# Patient Record
Sex: Female | Born: 1975 | Race: White | Hispanic: No | Marital: Married | Smoking: Former smoker
Health system: Southern US, Community
[De-identification: ages and names within clinical notes are randomized; demographics above are authoritative.]

## PROBLEM LIST (undated history)

## (undated) ENCOUNTER — Inpatient Hospital Stay (HOSPITAL_COMMUNITY): Payer: Self-pay

## (undated) DIAGNOSIS — R519 Headache, unspecified: Secondary | ICD-10-CM

## (undated) DIAGNOSIS — M329 Systemic lupus erythematosus, unspecified: Secondary | ICD-10-CM

## (undated) DIAGNOSIS — G8929 Other chronic pain: Secondary | ICD-10-CM

## (undated) DIAGNOSIS — E039 Hypothyroidism, unspecified: Secondary | ICD-10-CM

## (undated) DIAGNOSIS — O26851 Spotting complicating pregnancy, first trimester: Secondary | ICD-10-CM

## (undated) DIAGNOSIS — Z87898 Personal history of other specified conditions: Secondary | ICD-10-CM

## (undated) DIAGNOSIS — Z8669 Personal history of other diseases of the nervous system and sense organs: Secondary | ICD-10-CM

## (undated) DIAGNOSIS — R110 Nausea: Secondary | ICD-10-CM

## (undated) DIAGNOSIS — N3090 Cystitis, unspecified without hematuria: Secondary | ICD-10-CM

## (undated) DIAGNOSIS — F32A Depression, unspecified: Secondary | ICD-10-CM

## (undated) DIAGNOSIS — N979 Female infertility, unspecified: Secondary | ICD-10-CM

## (undated) DIAGNOSIS — F329 Major depressive disorder, single episode, unspecified: Secondary | ICD-10-CM

## (undated) DIAGNOSIS — N61 Mastitis without abscess: Secondary | ICD-10-CM

## (undated) DIAGNOSIS — F419 Anxiety disorder, unspecified: Secondary | ICD-10-CM

## (undated) DIAGNOSIS — R51 Headache: Secondary | ICD-10-CM

## (undated) DIAGNOSIS — A90 Dengue fever [classical dengue]: Secondary | ICD-10-CM

## (undated) DIAGNOSIS — Z8619 Personal history of other infectious and parasitic diseases: Secondary | ICD-10-CM

## (undated) DIAGNOSIS — Z8659 Personal history of other mental and behavioral disorders: Secondary | ICD-10-CM

## (undated) DIAGNOSIS — T7840XA Allergy, unspecified, initial encounter: Secondary | ICD-10-CM

## (undated) HISTORY — PX: ECTOPIC PREGNANCY SURGERY: SHX613

## (undated) HISTORY — DX: Cystitis, unspecified without hematuria: N30.90

## (undated) HISTORY — PX: MANDIBLE FRACTURE SURGERY: SHX706

## (undated) HISTORY — DX: Headache, unspecified: R51.9

## (undated) HISTORY — PX: COLON SURGERY: SHX602

## (undated) HISTORY — DX: Spotting complicating pregnancy, first trimester: O26.851

## (undated) HISTORY — DX: Mastitis without abscess: N61.0

## (undated) HISTORY — DX: Nausea: R11.0

## (undated) HISTORY — DX: Major depressive disorder, single episode, unspecified: F32.9

## (undated) HISTORY — PX: SALPINGECTOMY: SHX328

## (undated) HISTORY — DX: Systemic lupus erythematosus, unspecified: M32.9

## (undated) HISTORY — DX: Personal history of other infectious and parasitic diseases: Z86.19

## (undated) HISTORY — DX: Other chronic pain: G89.29

## (undated) HISTORY — DX: Headache: R51

## (undated) HISTORY — DX: Female infertility, unspecified: N97.9

## (undated) HISTORY — DX: Anxiety disorder, unspecified: F41.9

## (undated) HISTORY — DX: Personal history of other diseases of the nervous system and sense organs: Z86.69

## (undated) HISTORY — DX: Depression, unspecified: F32.A

## (undated) HISTORY — DX: Personal history of other mental and behavioral disorders: Z86.59

## (undated) HISTORY — DX: Hypothyroidism, unspecified: E03.9

## (undated) HISTORY — DX: Personal history of other specified conditions: Z87.898

## (undated) HISTORY — DX: Dengue fever (classical dengue): A90

## (undated) HISTORY — DX: Allergy, unspecified, initial encounter: T78.40XA

---

## 2002-09-19 ENCOUNTER — Other Ambulatory Visit: Admission: RE | Admit: 2002-09-19 | Discharge: 2002-09-19 | Payer: Self-pay | Admitting: Obstetrics and Gynecology

## 2003-10-03 ENCOUNTER — Other Ambulatory Visit: Admission: RE | Admit: 2003-10-03 | Discharge: 2003-10-03 | Payer: Self-pay | Admitting: Obstetrics and Gynecology

## 2004-09-29 ENCOUNTER — Emergency Department (HOSPITAL_COMMUNITY): Admission: EM | Admit: 2004-09-29 | Discharge: 2004-09-29 | Payer: Self-pay | Admitting: Emergency Medicine

## 2005-06-23 ENCOUNTER — Other Ambulatory Visit: Admission: RE | Admit: 2005-06-23 | Discharge: 2005-06-23 | Payer: Self-pay | Admitting: Obstetrics and Gynecology

## 2006-11-22 DIAGNOSIS — O26851 Spotting complicating pregnancy, first trimester: Secondary | ICD-10-CM

## 2006-11-22 HISTORY — DX: Spotting complicating pregnancy, first trimester: O26.851

## 2006-11-23 ENCOUNTER — Emergency Department (HOSPITAL_COMMUNITY): Admission: EM | Admit: 2006-11-23 | Discharge: 2006-11-24 | Payer: Self-pay | Admitting: Emergency Medicine

## 2006-11-24 DIAGNOSIS — R110 Nausea: Secondary | ICD-10-CM

## 2006-11-24 HISTORY — DX: Nausea: R11.0

## 2006-11-26 ENCOUNTER — Ambulatory Visit: Admission: AD | Admit: 2006-11-26 | Discharge: 2006-11-26 | Payer: Self-pay | Admitting: Obstetrics and Gynecology

## 2006-11-26 ENCOUNTER — Encounter (INDEPENDENT_AMBULATORY_CARE_PROVIDER_SITE_OTHER): Payer: Self-pay | Admitting: Obstetrics and Gynecology

## 2007-05-02 ENCOUNTER — Ambulatory Visit: Admission: AD | Admit: 2007-05-02 | Discharge: 2007-05-02 | Payer: Self-pay | Admitting: Obstetrics and Gynecology

## 2007-05-02 ENCOUNTER — Encounter (INDEPENDENT_AMBULATORY_CARE_PROVIDER_SITE_OTHER): Payer: Self-pay | Admitting: Obstetrics and Gynecology

## 2007-09-13 DIAGNOSIS — Z87898 Personal history of other specified conditions: Secondary | ICD-10-CM

## 2007-09-13 HISTORY — DX: Personal history of other specified conditions: Z87.898

## 2008-05-07 ENCOUNTER — Encounter (INDEPENDENT_AMBULATORY_CARE_PROVIDER_SITE_OTHER): Payer: Self-pay | Admitting: Obstetrics and Gynecology

## 2008-05-07 ENCOUNTER — Inpatient Hospital Stay (HOSPITAL_COMMUNITY): Admission: AD | Admit: 2008-05-07 | Discharge: 2008-05-09 | Payer: Self-pay | Admitting: Obstetrics and Gynecology

## 2008-05-24 DIAGNOSIS — N61 Mastitis without abscess: Secondary | ICD-10-CM

## 2008-05-24 HISTORY — DX: Mastitis without abscess: N61.0

## 2008-10-17 IMAGING — US US OB COMP LESS 14 WK
1 series · 14 of 28 positions shown · non-contrast
Comparison: none

CLINICAL DATA: Abdominal pain.  
OBSTETRICAL ULTRASOUND <14 WKS:
TECHNIQUE: Transabdominal ultrasound was performed for evaluation of the gestation as well as the maternal uterus and adnexal regions.

[Series 1: unknown · 0.32mm/px · 14 of 42 slices shown]
[im 2/42]
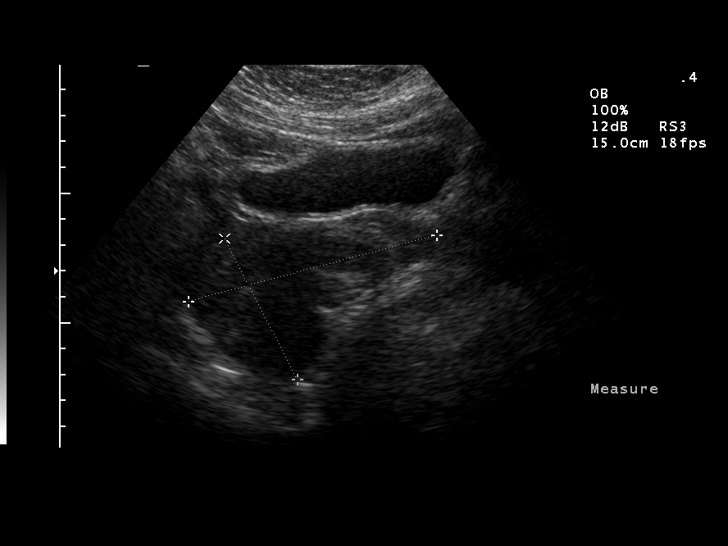
[im 5/42]
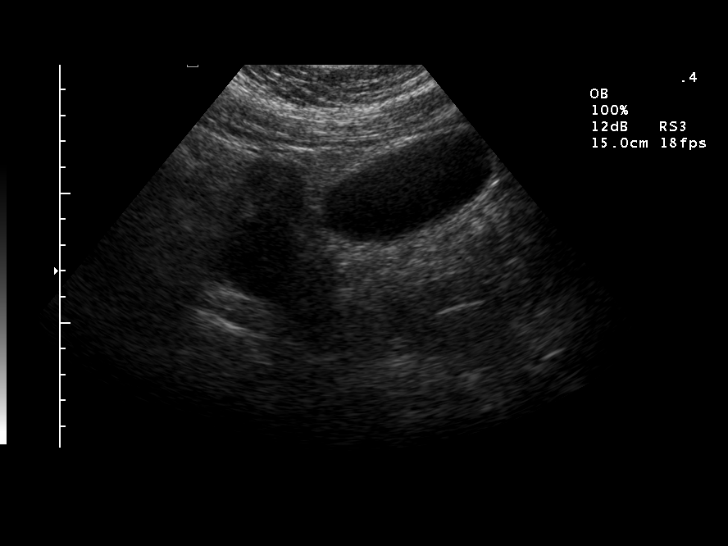
[im 8/42]
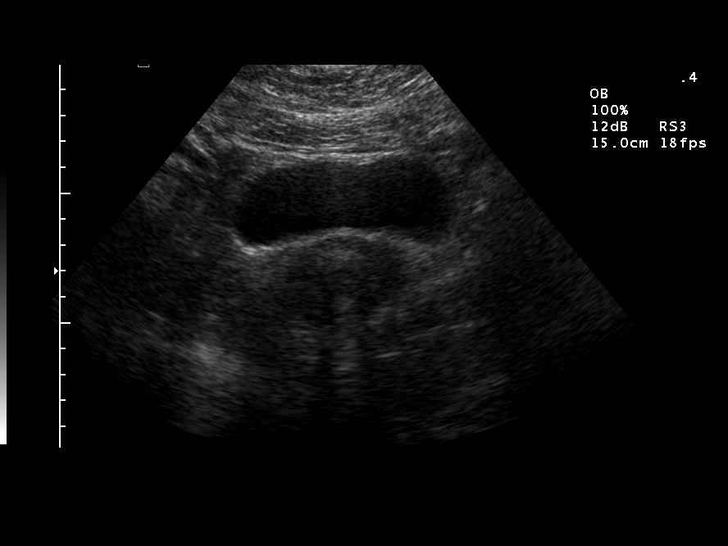
[im 11/42]
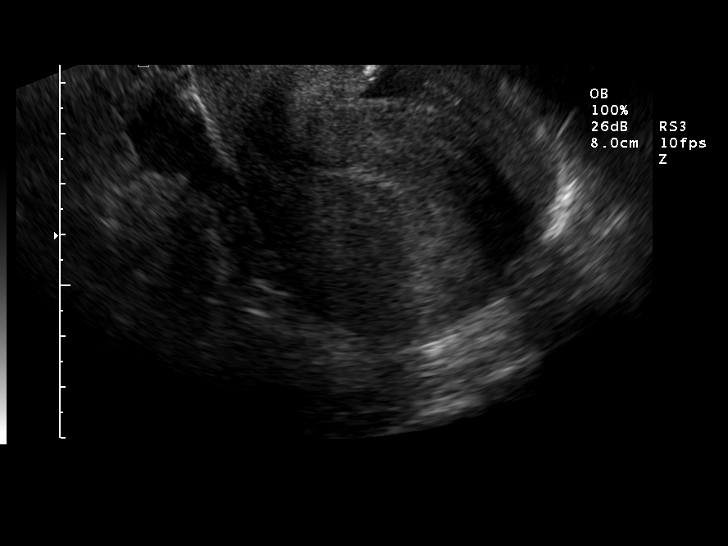
[im 14/42]
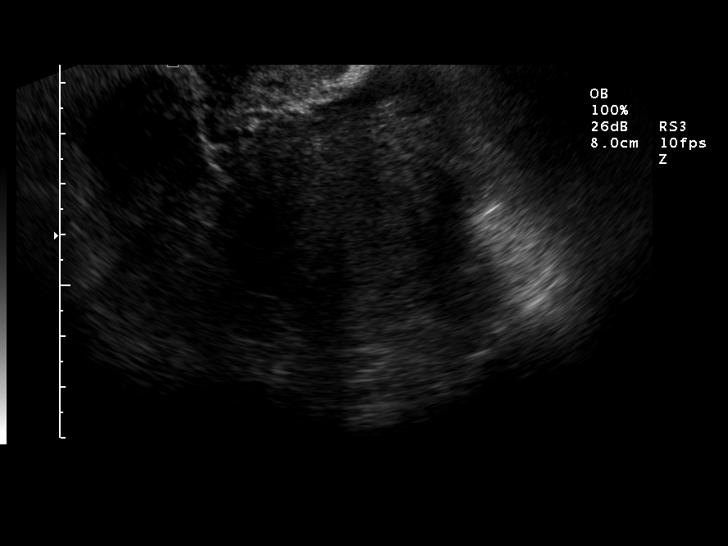
[im 17/42]
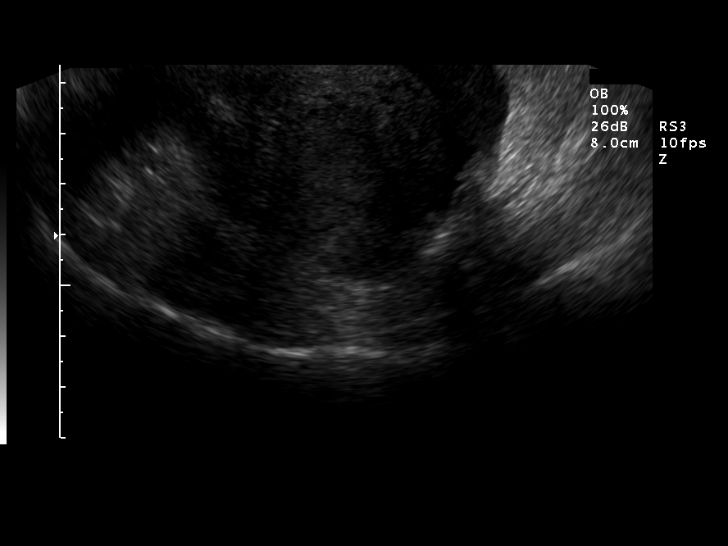
[im 20/42]
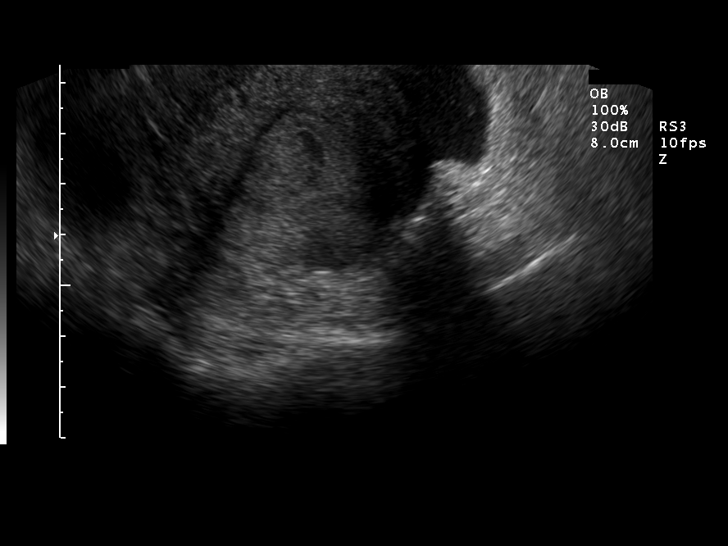
[im 23/42]
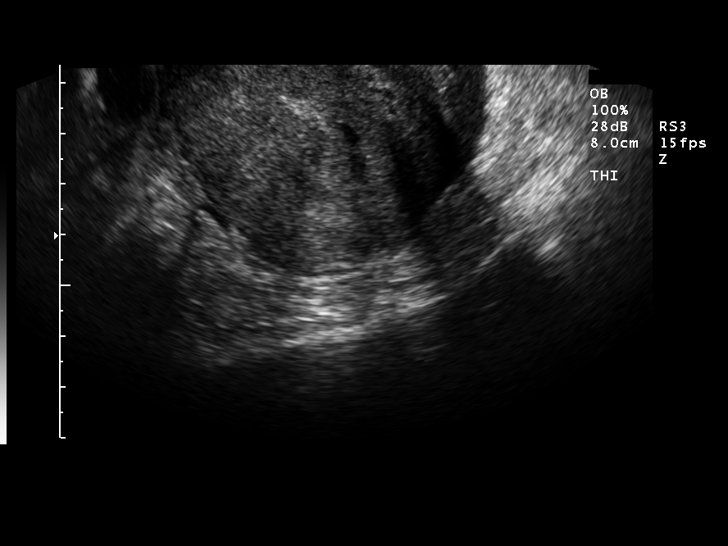
[im 26/42]
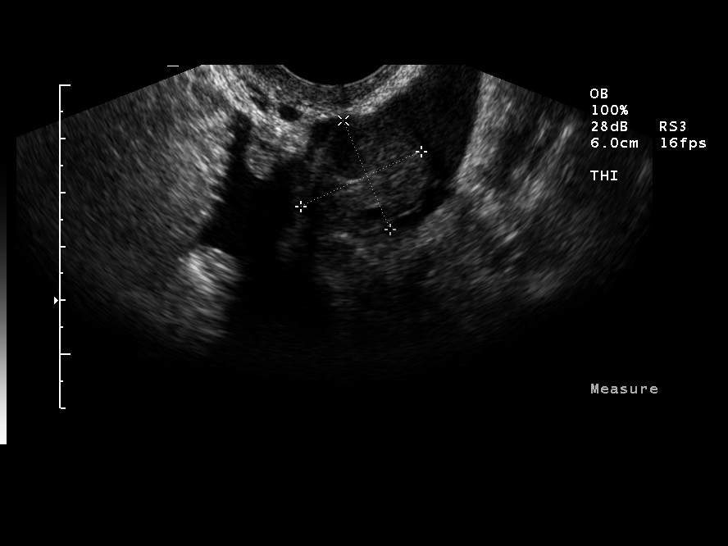
[im 29/42]
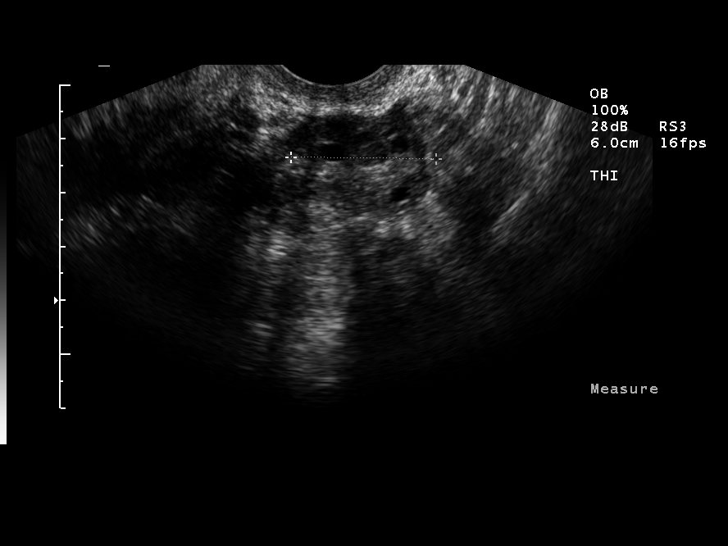
[im 32/42]
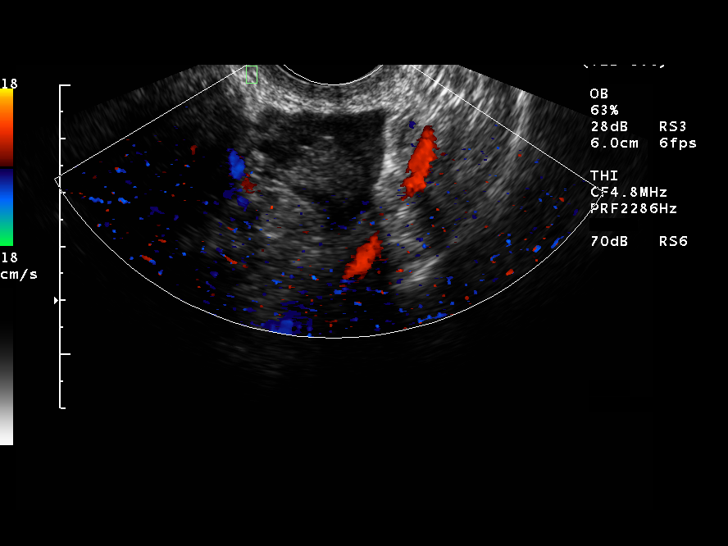
[im 35/42]
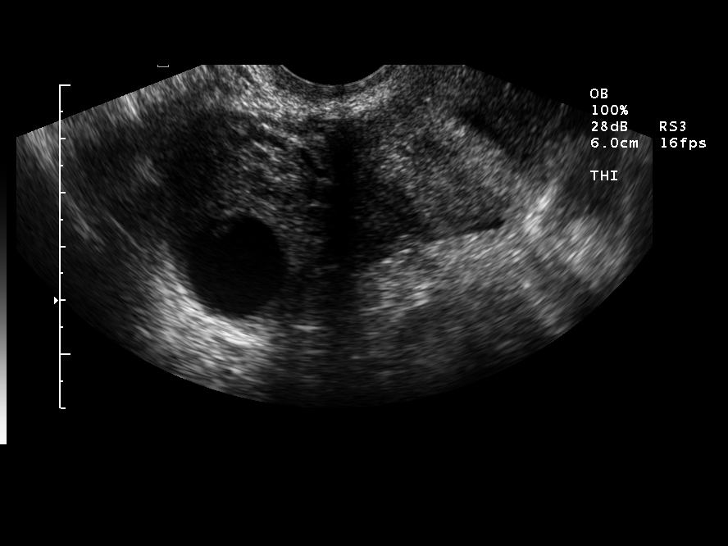
[im 38/42]
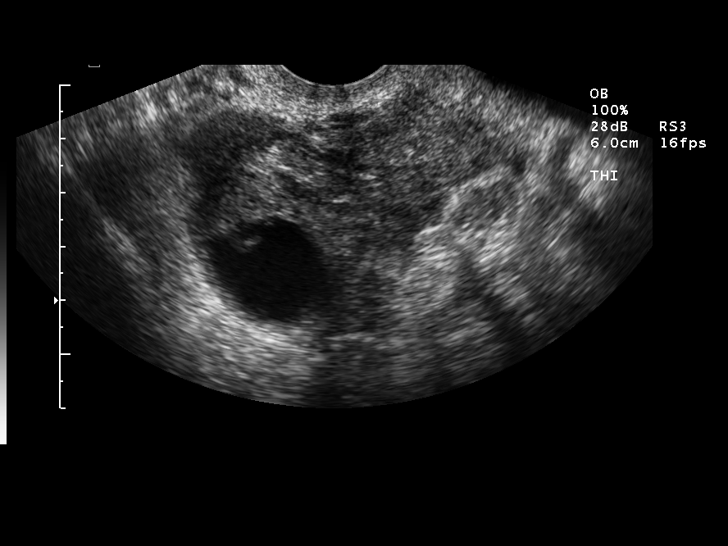
[im 42/42]
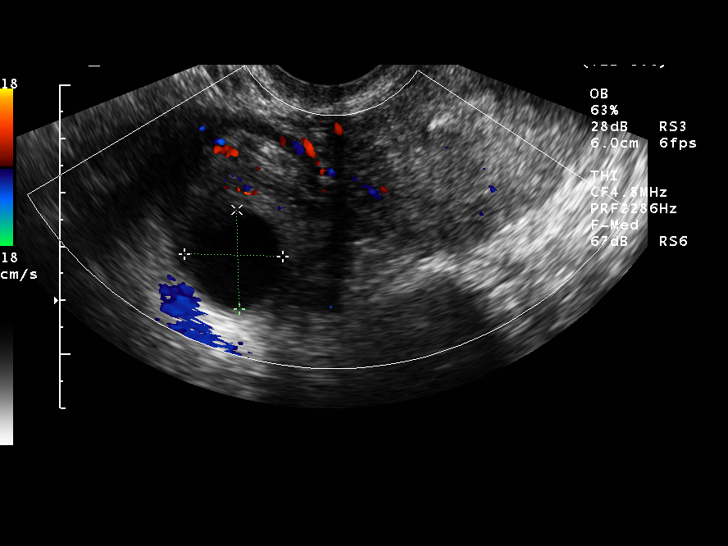

[14 of 28 positions shown; findings below may reference images not displayed]

FINDINGS: The uterus is retroverted.  Endometrial stripe measures 13 mm.  There appears to be a small amount of fluid in the endometrium but this does not clearly represent a gestational sac.  Left ovary measures 2.5 x 2.2 x 2.7 cm.  It is difficult to exclude free fluid throughout the pelvis due to extensive fluid in the bowel.   The right adnexa measures 4.1 x 3.3 x 4.2 cm.  There is a prominent anechoic structure associated with the right adnexa thought to represent a cyst measuring up to 2.0 cm.  There is mild irregularity or nodularity along the periphery of the cyst.
IMPRESSION: 1.  No clear evidence for an intrauterine gestational sac.  
2.  Question free fluid in the pelvis.  
3.  Right adnexal cyst.  
4.  Please note that an ectopic pregnancy cannot be excluded based on history and ultrasound findings.

## 2009-12-25 DIAGNOSIS — N979 Female infertility, unspecified: Secondary | ICD-10-CM

## 2009-12-25 HISTORY — DX: Female infertility, unspecified: N97.9

## 2010-08-18 LAB — CBC
HCT: 30.7 % — ABNORMAL LOW (ref 36.0–46.0)
HCT: 37.9 % (ref 36.0–46.0)
Hemoglobin: 10.5 g/dL — ABNORMAL LOW (ref 12.0–15.0)
Hemoglobin: 12.9 g/dL (ref 12.0–15.0)
MCHC: 34.1 g/dL (ref 30.0–36.0)
MCV: 88.8 fL (ref 78.0–100.0)
RBC: 3.46 MIL/uL — ABNORMAL LOW (ref 3.87–5.11)
RBC: 4.32 MIL/uL (ref 3.87–5.11)
RDW: 13.3 % (ref 11.5–15.5)

## 2010-09-16 NOTE — Op Note (Signed)
Sherry Johnston, Sherry Johnston NO.:  [id number]   MEDICAL RECORD NO.:  [id number]          PATIENT TYPE:  AMB   LOCATION:  DFTL                          FACILITY:  [facility]   PHYSICIAN:  Crist Fat. Rivard, M.D. DATE OF BIRTH:  Mar 22, 1976   DATE OF PROCEDURE:  DATE OF DISCHARGE:                               OPERATIVE REPORT   PREOPERATIVE DIAGNOSIS:  Right tubal pregnancy, recurrent.   POSTOPERATIVE DIAGNOSIS:  Right tubal pregnancy, recurrent.   ANESTHESIA:  General by Dr. Malen Gauze.   PROCEDURE:  Right salpingectomy via laparoscopy.   SURGEON:  Crist Fat. Rivard, M.D.   ASSISTANT:  Nigel Bridgeman, certified nurse midwife.   ESTIMATED BLOOD LOSS:  Minimal.   PROCEDURE IN DETAIL:  After being informed of the planned procedure with  possible complications including bleeding, infection, injury to other  organs and need for laparotomy, informed consent was obtained.  The  patient was taken to OR #4, given general anesthesia with endotracheal  intubation without any complication.  She is placed in the lithotomy  position, prepped and draped in a sterile fashion and a Foley catheter  was inserted in her bladder.  A speculum was inserted in the vagina and  the cervix was grasped with a tenaculum forceps placed on the anterior  lip of the cervix and an acorn intrauterine manipulator was placed.   We infiltrate the umbilical area with 4 mL of Marcaine 0.25%  and  perform a 10-mm incision sharply.  This was brought down bluntly to the  fascia which was grasped, identified and incised with Mayo scissors.  Peritoneum was entered bluntly.  A pursestring suture of 0 Vicryl was  placed on the fascia and a 10 mm Hasson trocar was inserted easily and  held in place with a pursestring suture.  This allowed for insufflation  of a pneumoperitoneum with CO2 at a maximum pressure of 15 mmHg.   The scope was inserted and we noted a mild hemoperitoneum with  approximately 50 mL of blood and a  right tubal pregnancy.  We then  proceeded with insertion of a 10-mm trocar in the suprapubic area after  infiltrating with 3 mL of Marcaine 0.25 and a right lower quadrant 5-mm  trocar insertion after infiltrating with Marcaine 0.25%, 2 mL.   The tube was grasped with an atraumatic grasper and a small adhesion  with the omentum is sharply section with hot scissors.  Using a tripolar  gyrus cauterization, we cauterize the tube entirely, the mesosalpinx,  and removed it sharply.  The proximal end of the tube was then  cauterized again and the specimen was removed via the 10-mm trocar.  The  ectopic pregnancy was situated in the isthmic ampullary area measuring  approximately 2.5 cm and we did note a normal fimbriae past that ectopic  pregnancy.  Hemostasis seemed adequate.  The left tube was evaluated  prior to performing the salpingectomy and appeared completely normal  with a normal fimbriae.  The uterus is normal, anterior and posterior  cul-de-sac were normal.  There were no adhesions in the pelvis other  than the previously-mentioned omental adhesion with  the ectopic  pregnancy.   We then profusely irrigated with warm saline and noted a clear return.  We could now evaluate the appendix which appeared normal. The liver,  gallbladder and the stomach edge also appeared normal.   Again hemostasis was checked and adequate.  Trocars were removed.  The  pneumoperitoneum was evacuated and the 10 mm umbilical incision in the  fascia was closed with the previously placed pursestring suture of 0  Vicryl.  The suprapubic 10-mm incision in the fascia was closed with a  figure-of-eight stitch of 0 Vicryl and then the skin of all three  incisions was closed with subcuticular suture of 3-0 Monocryl and  Dermabond.   COUNTS:  Instruments and sponge count was complete x2.   ESTIMATED BLOOD LOSS:  Minimal.   DISPOSITION:  The procedure is very well tolerated by the patient who  was taken to the  recovery room in a well and stable condition.   SPECIMEN:  Right tube with ectopic pregnancy sent to pathology.      Crist Fat Rivard, M.D.  Electronically Signed     SAR/MEDQ  D:  05/02/2007  T:  05/03/2007  Job:  [id number]

## 2010-09-16 NOTE — Op Note (Signed)
[name], Johnston NO.:  [id number]   MEDICAL RECORD NO.:  [id number]          PATIENT TYPE:  AMB   LOCATION:  DFTL                          [facility]ACILITY:  [facility]   PHYSICIAN:  Crist Fat. Rivard, M.D. DATE OF BIRTH:  1975-10-29   DATE OF PROCEDURE:  11/26/2006  DATE OF DISCHARGE:                               OPERATIVE REPORT   PREOPERATIVE DIAGNOSIS:  Right tubal pregnancy.   POSTOPERATIVE DIAGNOSIS:  Right tubal pregnancy.   ANESTHESIA:  General, Dr. Harvest Forest   PROCEDURE:  Laparoscopy with cure of right tubal pregnancy.   SURGEON:  Crist Fat. Rivard, M.D.   ASSISTANT:  None.   ESTIMATED BLOOD LOSS:  200 mL.   PROCEDURE IN DETAIL:  After being informed of the planned procedure with  possible complications including bleeding, infection, injury to bowel,  bladder or ureters, need for laparotomy as well as loss of her tube and  ovary, informed consent is obtained.  The patient is taken to OR 4 and  given general anesthesia with endotracheal intubation without any  complication.  She is placed in lithotomy position, prepped and draped  in a sterile fashion.  A Foley catheter is inserted in her bladder.  GYN  exam reveals a retroverted uterus, normal in size, no masses felt.  A  speculum is inserted.  The anterior lip of the cervix was grasped with a  tenaculum forceps and an acorn manipulator is placed.   We infiltrated the umbilical area with 5 mL of Marcaine 0.25% and  perform a semielliptical incision, brought down sharply to the fascia  which is identified and grasped with the Kocher forceps.  The fascia is  incised.  Peritoneum is entered bluntly.  A pursestring suture of 0  Vicryl is placed on the fascia and a 10 mm Hassan trocar is inserted and  held in place with a pursestring suture.  We can now insufflate a  pneumoperitoneum with CO2 at a maximum pressure of 15 mmHg and insert a  laparoscope to confirm ectopic pregnancy on the right tube.  A 10 mm  suprapubic trocar as well as a 5 mm right lower quadrant trocar had been  inserted under direct visualization after infiltrating with Marcaine  0.25%.   Observation:  Anterior cul-de-sac is normal.  The uterus is normal.  Posterior cul-de-sac contains approximately 150 mL of clotted blood.  The right tube in the isthmic ampullary area as a large 2.5 to 3 cm mass  compatible with ectopic pregnancy. Right ovary is normal. Left tube is  normal up to the fimbriae and left ovary is normal.  Appendix is  visualized and normal. Liver and gallbladder were visualized and normal.   Using a 24 gauge spinal needle, we infiltrate the mesosalpinx of the  right tube using vasopressin with a dilution of 50 units in 100 mL.  This allows [place] to perform a linear salpingostomy using the gyrus needle  and we can then evacuate the pregnancy with hydrodissection Ne[place]hat.  The  pregnancy is removed and sent to pathology.  We then irrigate profusely  with warm saline until clear return. Hemostasis is adequate.  We can now  remove our trocars under direct visualization and evacuate the  pneumoperitoneum.  The fascia of the umbilical area is closed with the  previously placed pursestring suture.  The fascia of the suprapubic area  is closed with a figure-of-eight stitch of 0 Vicryl.  The skin of all  incisions is closed with subcuticular suture of 3-0 Monocryl as well as  Steri-Strips.  Instrument and sponge count is complete x2.  Estimated  blood loss including the existing hemoperitoneum is 200 mL. The  procedure is well tolerated by the patient who is taken to the recovery  room in a well and stable condition.   SPECIMEN:  Products of conception from right tube are sent to pathology.      Crist Fat Rivard, M.D.  Electronically Signed     SAR/MEDQ  D:  11/26/2006  T:  11/27/2006  Job:  [id number]

## 2010-09-16 NOTE — H&P (Signed)
NAMEELANAH, OSMANOVIC NO.:  [id number]   MEDICAL RECORD NO.:  [id number]          PATIENT TYPE:  INP   LOCATION:  9162                          FACILITY:  [facility]   PHYSICIAN:  Osborn Coho, M.D.   DATE OF BIRTH:  05-17-1975   DATE OF ADMISSION:  05/07/2008  DATE OF DISCHARGE:                              HISTORY & PHYSICAL   HISTORY OF PRESENT ILLNESS:  Ms. Sherry Johnston is a 35 year old gravida 3,  para 0-0-2-0 at 40 weeks and 2 days who presents with grossly ruptured  membranes.  She is followed by the midwives at [facility].  Her pregnancy is remarkable for:  1. History of ectopic x2 with right salpingectomy on the second      ectopic.  2. Hypothyroid dysfunction.  3. Migraines.  4. History of depression with medication management.  5. History of bulimia.  6. History of smoking with cessation before the pregnancy.  7. History of multiple fractures of various body parts.   PRENATAL LABORATORIES:  Starting with her initial office visit back in  June included a hemoglobin of 13.6, hematocrit of 38.8, platelet count  of 234,000, blood type O+, antibody screen negative, toxoplasmosis  titers negative, RPR nonreactive, rubella titer immune, hepatitis B  surface antigen negative, HIV nonreactive, Pap negative, gonorrhea  negative, Chlamydia negative.  TSH normal.  T3, T4 normal.  Her 1-hour  Glucola at 26-1/2 weeks was 137.  Her 3-hour Glucola two weeks later was  within normal limits.  Her beta strep test on December 7, after 6 weeks  was negative.   HISTORY OF PRESENT PREGNANCY:  Ms. Sherry Johnston presented to the practice  back in June.  At first trimester, she was approximately 10 weeks.  She  was on Vicodin at the time for management of her migraine headaches, and  she was very anxious.  She had been managed for her depression with  Effexor in the past, but she had been weaned off of it.  Her thyroid  panel was normal at that time.  She was encouraged  to continue her  headache management with [facility] but did not  want to go there.  At 13 weeks, she was given a prescription for  Fioricet for her management of her migraines.  At 18 weeks, she had an  ultrasound which showed a single intrauterine pregnancy with size  concurrent with dates.  Cervix was 4.3 cm at the time.  Placenta was  anterior three-vessel cord.  All anatomy was within normal limits, and  AFP was normal at that time as well.  At 26 weeks, she was having some  anxiety issues and was begun on Vistaril 25 mg every 6 hours with good  relief.  At that time, she did fail her 1-hour Glucola.  She had some  stressors in her personal life regarding finances, work and trying to  sell her house.  She made some decisions to decrease stress, took her  house off the market, continued to take her Fioricet, passed the 3-hour  Glucola.  She got a bladder infection at 32 weeks was given  some  Macrobid.  She continued on Fioricet and the Vistaril.  She got an H1N1  vaccine, and last month of pregnancy was unremarkable.   OBSTETRICAL HISTORY:  In July 2008, she had an ectopic pregnancy at 5-[redacted]  weeks gestation which was surgically removed by Dr. Dois Davenport Rivard in  December 2008.  She had another ectopic pregnancy.  The right tube was  removed by Dr. Estanislado Pandy and gravida 3 is current pregnancy.   ALLERGIES:  SHE HAS, AS STATED, DRUG ALLERGY TO IMITREX WITH RESULTING  CHEST TIGHTNESS.  SHE HAS NO LATEX ALLERGIES AND NO KNOWN FOOD  ALLERGIES.   MEDICAL HISTORY:  1. History of rare yeast infections.  2. Shingles at age 36.  3. Low thyroid and temporarily took Synthroid, but was taken off of      it.  4. Bladder infections in the past, and one during this pregnancy.  5. History of migraines which are contributed to teeth grinding.  6. History of depression and was on Effexor in the past.  7. History of bulimia in high school and college.  8. History of smoking and  states she quit a year before she got      pregnant.  9. History of anesthesia complications saying she has severe      constipation following anesthesia.  10.Multiple fractures from various accidents.  The patient states she      is just accident prone.  She has broken her left arm, her right      wrist, her right arm, it has been taken out of her socket.  She has      cracked her right ankle, broken her tail bone x2 and has had two      jaw injuries from grinding and clinching her teeth.   FAMILY HISTORY:  Ms. Deloretto's family history includes maternal  grandmother having bypass surgery for cardiac problems, maternal  grandmother with chronic hypertension.  The patient's mother has  varicose veins.  Maternal grandmother has emphysema.  Her dad has  borderline diabetes.  Her first cousin has type 1 diabetes.  Maternal  grandmother has thyroid cancer.  Her brother has some kind of deformity  of his kidneys.  Paternal grandfather has strokes.  Maternal grandmother  fibromyalgia.  Mom with breast cancer.  Dad prostate cancer.  Maternal  grandmother thyroid cancer.  Paternal grandfather bladder cancer.  Her  mother is a agoraphobic.  Her brother has ADHD.   SURGICAL HISTORY:  The patient has had multiple surgeries.  She had:  1. Ectopic pregnancy surgery twice in 2008.  2. Jaw surgery in 2000.   GENETIC HISTORY:  The patient did not have a cystic fibrosis screen.  Genetic a history is noncontributory for her and her side of the family,  but for the father's of the baby's side of the family it is noteworthy  he has a first cousin with some type of deformity, and his mother has  immune system problems.   SOCIAL HISTORY:  Ms. Sherry Johnston is married to [employer].  She has  Bachelor's degree and works as an Secretary/administrator.  He  works in the Scientific laboratory technician.  She does not state her religion.  She  denies use of alcohol or tobacco products or street drugs during this   pregnancy.  She has taken Hydrocodone and Flexeril as well as Vistaril  during pregnancy for management of migraines and anxiety.   PHYSICAL EXAMINATION:  VITAL SIGNS:  Stable.  The patient  is afebrile.  LUNGS:  Clear to auscultation bilaterally.  HEENT:  Within normal limits.  HEART:  Regular rate and rhythm.  No murmurs.  BREASTS:  Soft and nontender.  ABDOMEN:  Gravid and soft, appropriate for just a little gestational  age.  EXTREMITIES:  Without any edema.  DTRs +2, +2.  Negative Homans and  clonus.  PELVIC:  Fetal heart rate 135, reassuring.  Contractions irregular mild.  The patient does not feel they are anywhere from 3-6 minutes, 50-70  seconds mild.  Vaginal exam 1-2 cm, 80% effaced, -3, very posterior  vertex, clear fluid is leaking from the vagina.  Bishop score is 5.   IMPRESSION:  A 35 year old G3, P 0-0-2-0 at 40.2 weeks, spontaneous  rupture of membranes, inadequate contractions, GBS negative.   PLAN:  Admit to birthing suites.  CNM management.  Pitocin augmentation.  Consultation with Dr. Su Hilt.      Eulogio Bear, CNM      Osborn Coho, M.D.  Electronically Signed    JM/MEDQ  D:  05/07/2008  T:  05/07/2008  Job:  [id number]

## 2011-02-06 LAB — CBC
HCT: 39.1
MCHC: 35.5
MCV: 86.4
Platelets: 326
RDW: 12.3
WBC: 10.4

## 2011-02-16 LAB — CBC
Platelets: 296
RDW: 12.6
WBC: 11.8 — ABNORMAL HIGH

## 2011-02-16 LAB — URINALYSIS, ROUTINE W REFLEX MICROSCOPIC
Glucose, UA: NEGATIVE
Ketones, ur: NEGATIVE
Nitrite: NEGATIVE
Protein, ur: NEGATIVE
pH: 7.5

## 2011-02-16 LAB — DIFFERENTIAL
Basophils Absolute: 0
Lymphocytes Relative: 24
Lymphs Abs: 2.8
Neutro Abs: 8.2 — ABNORMAL HIGH
Neutrophils Relative %: 69

## 2011-02-16 LAB — HCG, QUANTITATIVE, PREGNANCY: hCG, Beta Chain, Quant, S: 2786 — ABNORMAL HIGH

## 2011-05-05 NOTE — L&D Delivery Note (Signed)
Delivery Note At 1:45 AM a viable female was delivered in McRoberts position via Vaginal, Spontaneous Delivery (Presentation: Left Occiput Anterior).  Easy delivery of the head, loose nuchal cord x 1 slipped, delivery of the shoulders, bulb suction mouth and nares, cord doubly clamped and cut and by taken to RN at warmer. APGAR: 9, 9; weight 8 lb 3 oz (3714 g).   Placenta status: Intact, Spontaneous.  Cord: 3 vessels.  Anesthesia: Epidural  Episiotomy: None Lacerations: 2nd degree;Vaginal Suture Repair: 3.0 and 4.0 monocryl for good heomstasis Est. Blood Loss (mL): 350  Mom to postpartum.  Baby to nursery-stable.  Sherry Johnston 04/19/2012, 2:15 AM

## 2011-06-05 LAB — SICKLE CELL SCREEN: Sickle Cell Screen: NEGATIVE

## 2011-08-31 ENCOUNTER — Telehealth: Payer: Self-pay | Admitting: Obstetrics and Gynecology

## 2011-08-31 MED ORDER — BUTALBITAL-APAP-CAFFEINE 50-325-40 MG PO TABS: 1.0000 | ORAL_TABLET | ORAL | Status: DC | PRN

## 2011-08-31 NOTE — Telephone Encounter (Signed)
Routed to triage 

## 2011-08-31 NOTE — Telephone Encounter (Signed)
Consult with VL.  May order RX for Fioricet. TC to pt. Informed may use but to try Tylenol if headache recurs. Pt verbalizes comprehension.

## 2011-08-31 NOTE — Telephone Encounter (Signed)
TC to pt.  States [redacted]wk pregnant by IVF.  Is having headaches, some mirgraine, 2-3x/wk,  Has tried Tylenol w/caffeine with no relief.  Advised increased water to 8-10 glasses /day.  Pt states ws given RX for Fioricet during last pregnancy which was effective.  To consult with provider.

## 2011-09-11 ENCOUNTER — Ambulatory Visit (INDEPENDENT_AMBULATORY_CARE_PROVIDER_SITE_OTHER): Payer: BC Managed Care – HMO

## 2011-09-11 DIAGNOSIS — Z331 Pregnant state, incidental: Secondary | ICD-10-CM

## 2011-09-11 LAB — CBC
Hemoglobin: 13 g/dL (ref 12.0–15.0)
MCH: 29.5 pg (ref 26.0–34.0)
MCHC: 33.8 g/dL (ref 30.0–36.0)
Platelets: 272 10*3/uL (ref 150–400)
RBC: 4.41 MIL/uL (ref 3.87–5.11)

## 2011-09-11 LAB — POCT URINALYSIS DIPSTICK
Glucose, UA: NEGATIVE
Leukocytes, UA: NEGATIVE
Nitrite, UA: NEGATIVE
Urobilinogen, UA: NEGATIVE
pH: 5

## 2011-09-11 LAB — RUBELLA SCREEN: Rubella: 46.1 IU/mL — ABNORMAL HIGH

## 2011-09-12 LAB — ABO

## 2011-09-13 LAB — CULTURE, OB URINE

## 2011-09-17 ENCOUNTER — Ambulatory Visit (INDEPENDENT_AMBULATORY_CARE_PROVIDER_SITE_OTHER): Payer: BC Managed Care – HMO | Admitting: Obstetrics and Gynecology

## 2011-09-17 ENCOUNTER — Encounter: Payer: Self-pay | Admitting: Obstetrics and Gynecology

## 2011-09-17 VITALS — BP 106/60 | Ht 64.0 in | Wt 172.0 lb

## 2011-09-17 DIAGNOSIS — Z8759 Personal history of other complications of pregnancy, childbirth and the puerperium: Secondary | ICD-10-CM | POA: Insufficient documentation

## 2011-09-17 DIAGNOSIS — E039 Hypothyroidism, unspecified: Secondary | ICD-10-CM | POA: Insufficient documentation

## 2011-09-17 DIAGNOSIS — Z8659 Personal history of other mental and behavioral disorders: Secondary | ICD-10-CM | POA: Insufficient documentation

## 2011-09-17 DIAGNOSIS — Z331 Pregnant state, incidental: Secondary | ICD-10-CM

## 2011-09-17 DIAGNOSIS — F17200 Nicotine dependence, unspecified, uncomplicated: Secondary | ICD-10-CM | POA: Insufficient documentation

## 2011-09-17 DIAGNOSIS — Z8742 Personal history of other diseases of the female genital tract: Secondary | ICD-10-CM

## 2011-09-17 LAB — POCT OSOM BVBLUE TEST: Bacterial Vaginosis: NEGATIVE

## 2011-09-17 NOTE — Progress Notes (Signed)
Patient ID: Sherry Johnston, female   DOB: 12/27/1975, 36 y.o.   MRN: [id number]  Subjective:    Sherry Johnston is being seen today for her first obstetrical visit.  She is at [redacted]w[redacted]d gestation by ultrasound currently on vaginal progesterone (crinone 8 % daily) and oral Prometrium 400 mg daily to discontinue 10/02/11.Also on ASA 81 mg daily to discontinue on 10/02/11. Conceived with IVF with Dr Elesa Hacker. Her obstetrical history is significant for: 3 previous ectopic pregnancies,s/p right salpyngectomy, borderline hypothyroidism not on medication, reformed smoker 2008,migraines.   Relationship with FOB: spouse, living together.  Patient does intend to breast feed.  Pregnancy history fully reviewed.   The following portions of the patient's history were reviewed and updated as appropriate: allergies, current medications, past family history, past medical history, past social history, past surgical history and problem list.  Review of Systems Pertinent ROS is described in HPI   Objective:   BP 106/60  Ht 5\' 4"  (1.626 m)  Wt 172 lb (78.019 kg)  BMI 29.52 kg/m2  Breastfeeding? Unknown Wt Readings from Last 1 Encounters:  09/17/11 172 lb (78.019 kg)   BMI: Body mass index is 29.52 kg/(m^2).  General: alert, cooperative and no distress Respiratory: clear to auscultation bilaterally Cardiovascular: regular rate and rhythm, S1, S2 normal, no murmur Gastrointestinal: soft, non-tender; no masses,  no organomegaly Extremities: extremities normal, no pain or edema Vaginal Bleeding: none  EXTERNAL GENITALIA: normal appearing vulva with no masses, tenderness or lesions VAGINA: no abnormal discharge or lesions CERVIX: no lesions or cervical motion tenderness UTERUS: gravid and consistent with 11 weeks ADNEXA: no masses palpable and nontender OB EXAM PELVIMETRY: appears adequate, proven to 6 lbs 10 oz   FHR:  152  bpm  Assessment:    Pregnancy at 10 and 1/7 weeks  Pregnancy risk status:   complicated by IVF conception, migraines   Plan:     Prenatal panel reviewed and discussed with the patient:Labs drawn today Pap smear collected:no due January 2014 GC/Chlamydia collected:yes Wet prep:  Discussion of First Screen  requested. Prenatal vitamins recommended Problem list reviewed and updated.  Plan of care: Follow up in 2 weeks.for 1st trimester visit Additional testing planned: growth sono 28 and 34 weeks due to IVF pregnancy  Abdelaziz Westenberger A  MD 09/17/2011 3:17 PM

## 2011-09-17 NOTE — Progress Notes (Signed)
Pt has headaches, nausea and sore lump under right arm x 6 weeks JM

## 2011-09-22 NOTE — Telephone Encounter (Signed)
Laura/epic °

## 2011-09-23 NOTE — Telephone Encounter (Signed)
Pt notified, ok to stop at the end of Rx per SR.  Pt agreeable.  ld

## 2011-09-23 NOTE — Telephone Encounter (Signed)
Ok to stop at end of script

## 2011-09-23 NOTE — Telephone Encounter (Signed)
Pt questioning her progesterone supplementation.  About to run out and wants to know if she is to be on suppositories or oral?  ld

## 2011-09-30 ENCOUNTER — Other Ambulatory Visit: Payer: Self-pay

## 2011-09-30 ENCOUNTER — Other Ambulatory Visit: Payer: Self-pay | Admitting: Obstetrics and Gynecology

## 2011-09-30 DIAGNOSIS — Z331 Pregnant state, incidental: Secondary | ICD-10-CM

## 2011-10-02 ENCOUNTER — Ambulatory Visit (INDEPENDENT_AMBULATORY_CARE_PROVIDER_SITE_OTHER): Payer: BC Managed Care – HMO

## 2011-10-02 ENCOUNTER — Other Ambulatory Visit: Payer: Self-pay | Admitting: Obstetrics and Gynecology

## 2011-10-02 DIAGNOSIS — Z36 Encounter for antenatal screening of mother: Secondary | ICD-10-CM

## 2011-10-02 DIAGNOSIS — Z331 Pregnant state, incidental: Secondary | ICD-10-CM

## 2011-10-02 LAB — US OB COMP LESS 14 WKS

## 2011-10-13 ENCOUNTER — Ambulatory Visit (INDEPENDENT_AMBULATORY_CARE_PROVIDER_SITE_OTHER): Payer: BC Managed Care – HMO | Admitting: Obstetrics and Gynecology

## 2011-10-13 VITALS — BP 104/64 | Wt 173.0 lb

## 2011-10-13 DIAGNOSIS — N979 Female infertility, unspecified: Secondary | ICD-10-CM

## 2011-10-13 DIAGNOSIS — Z331 Pregnant state, incidental: Secondary | ICD-10-CM

## 2011-10-13 NOTE — Progress Notes (Signed)
Pt stated having really bad headache's and having some ligament pain.pt stated no other issues

## 2011-10-13 NOTE — Progress Notes (Signed)
C/O headaches ( known for it): OK to start Ibuprofen Phenergan makes her feel sleepy Ist trimester screen normal AFP and TSH at next visit

## 2011-11-02 ENCOUNTER — Encounter: Payer: Self-pay | Admitting: Obstetrics and Gynecology

## 2011-11-02 ENCOUNTER — Ambulatory Visit (INDEPENDENT_AMBULATORY_CARE_PROVIDER_SITE_OTHER): Payer: BC Managed Care – HMO | Admitting: Obstetrics and Gynecology

## 2011-11-02 VITALS — BP 102/60 | Wt 175.0 lb

## 2011-11-02 DIAGNOSIS — E039 Hypothyroidism, unspecified: Secondary | ICD-10-CM

## 2011-11-02 DIAGNOSIS — Z3480 Encounter for supervision of other normal pregnancy, unspecified trimester: Secondary | ICD-10-CM

## 2011-11-02 NOTE — Progress Notes (Signed)
No complaints.  Migraines responsive to ibuprofen and sudafed RLS seems worse.  May be because of antihistamine use according to UpToDate Reviewed nl first trimester screen AFP today Anatomy US @ [place] Next TSH due with 1 hr glucola

## 2011-11-03 LAB — ALPHA FETOPROTEIN, MATERNAL: Open Spina bifida: NEGATIVE

## 2011-11-16 ENCOUNTER — Ambulatory Visit (INDEPENDENT_AMBULATORY_CARE_PROVIDER_SITE_OTHER): Payer: BC Managed Care – HMO | Admitting: Obstetrics and Gynecology

## 2011-11-16 ENCOUNTER — Ambulatory Visit (INDEPENDENT_AMBULATORY_CARE_PROVIDER_SITE_OTHER): Payer: BC Managed Care – HMO

## 2011-11-16 VITALS — BP 98/52 | Wt 173.0 lb

## 2011-11-16 DIAGNOSIS — Z331 Pregnant state, incidental: Secondary | ICD-10-CM

## 2011-11-16 DIAGNOSIS — Z3689 Encounter for other specified antenatal screening: Secondary | ICD-10-CM

## 2011-11-16 NOTE — Patient Instructions (Signed)

## 2011-11-16 NOTE — Progress Notes (Signed)
Ultrasound: Single gestation, normal fluid, 18 weeks and 5 days (51st percentile), cervix 4.67 cm, female, normal anatomy except that her heart views not seen. Repeat ultrasound in 6-8 weeks. Complains of back pain.  Handout given. Return to office in 4 weeks. Dr. Stefano Gaul

## 2011-11-16 NOTE — Progress Notes (Signed)
Pt stated no issues today.  

## 2011-11-17 ENCOUNTER — Other Ambulatory Visit: Payer: Self-pay | Admitting: Obstetrics and Gynecology

## 2011-11-17 DIAGNOSIS — Z331 Pregnant state, incidental: Secondary | ICD-10-CM

## 2011-11-17 LAB — US OB COMP + 14 WK

## 2011-11-25 ENCOUNTER — Telehealth: Payer: Self-pay | Admitting: Obstetrics and Gynecology

## 2011-11-25 NOTE — Telephone Encounter (Signed)
Triage/epic 

## 2011-11-25 NOTE — Telephone Encounter (Signed)
sr pt 

## 2011-11-25 NOTE — Telephone Encounter (Signed)
PC from pt regarding BH.  No bleeding, +FM.  Not painful at all just uncomfortable.  Is this normal for 20 weeks.  Will consult with [facility].  Call back to pt.  Per [place], normal for 20 + weeks. Increase water and watch for changes, such as, bleeding a/o change in d/c. Pt to call with any other concerns before next appt.  Pt agreeable.  ld

## 2011-12-06 ENCOUNTER — Telehealth: Payer: Self-pay | Admitting: Obstetrics and Gynecology

## 2011-12-06 NOTE — Telephone Encounter (Signed)
TC from patient--21 weeks, with small brown spotting x 1 today.  No pain, no recent IC.  No hx PTL with previous pregnancy, and no issues with this pregnancy.  +FM.  Reviewed status--offered evaluation in MAU.  Patient prefers evaluation in office tomorrow unless further symptoms occur today.    Will come to office in am after dropping daughter off at school at 9am.  Will call with any additional issues.

## 2011-12-07 ENCOUNTER — Ambulatory Visit (INDEPENDENT_AMBULATORY_CARE_PROVIDER_SITE_OTHER): Payer: BC Managed Care – HMO | Admitting: Obstetrics and Gynecology

## 2011-12-07 VITALS — BP 110/62 | Wt 176.0 lb

## 2011-12-07 DIAGNOSIS — Z331 Pregnant state, incidental: Secondary | ICD-10-CM

## 2011-12-07 DIAGNOSIS — N898 Other specified noninflammatory disorders of vagina: Secondary | ICD-10-CM

## 2011-12-07 DIAGNOSIS — N899 Noninflammatory disorder of vagina, unspecified: Secondary | ICD-10-CM

## 2011-12-07 MED ORDER — TERCONAZOLE 0.8 % VA CREA: 1.0000 | TOPICAL_CREAM | Freq: Every day | VAGINAL | Status: AC

## 2011-12-07 NOTE — Progress Notes (Signed)
[redacted]w[redacted]d C/o vaginal irritation. Speculum Examination; PH 4.0 no whiff, Yeast. No blood in vault, vaginal walls normal, Cx normal. Tx Terazol cream PV x 3 nights.

## 2011-12-08 ENCOUNTER — Encounter: Payer: BC Managed Care – HMO | Admitting: Obstetrics and Gynecology

## 2011-12-08 LAB — GC/CHLAMYDIA PROBE AMP, GENITAL: Chlamydia, DNA Probe: NEGATIVE

## 2011-12-14 ENCOUNTER — Ambulatory Visit (INDEPENDENT_AMBULATORY_CARE_PROVIDER_SITE_OTHER): Payer: BC Managed Care – HMO | Admitting: Obstetrics and Gynecology

## 2011-12-14 ENCOUNTER — Encounter: Payer: Self-pay | Admitting: Obstetrics and Gynecology

## 2011-12-14 VITALS — BP 102/58 | Wt 178.0 lb

## 2011-12-14 DIAGNOSIS — Z331 Pregnant state, incidental: Secondary | ICD-10-CM

## 2011-12-14 NOTE — Progress Notes (Signed)
No complaints

## 2011-12-14 NOTE — Progress Notes (Signed)
[redacted]w[redacted]d Doing well, no c/o, YI sx's gone HA's controlled w fiorcet and sudafed PRN, states she often wakes up w "sinus pressure" rec also trying benedryl at night Plan BF, epidural, peds: Dr Eddie Candle rec melatonin for sleep rec mag supplement for muscle cramps rv'd stretches for sciatic pain 4wks - plan [place] for growth and f/u anatomy views, and 1hr gtt, will also check TSH [place] (hx hypothyroid)

## 2011-12-14 NOTE — Patient Instructions (Signed)
Preventing Preterm Labor Preterm labor is when a pregnant woman has contractions that cause the cervix to open, shorten, and thin before 37 weeks of pregnancy. You will have regular contractions (tightening) 2 to 3 minutes apart. This usually causes discomfort or pain. HOME CARE  Eat a healthy diet.   Take your vitamins as told by your doctor.   Drink enough fluids to keep your pee (urine) clear or pale yellow every day.   Get rest and sleep.   Do not have sex if you are at high risk for preterm labor.   Follow your doctor's advice about activity, medicines, and tests.   Avoid stress.   Avoid hard labor or exercise that lasts for a long time.   Do not smoke.  GET HELP RIGHT AWAY IF:   You are having contractions.   You have belly (abdominal) pain.   You have bleeding from your vagina.   You have pain when you pee (urinate).   You have abnormal discharge from your vagina.   You have a temperature by mouth above 102 F (38.9 C).  MAKE SURE YOU:  Understand these instructions.   Will watch your condition.   Will get help if you are not doing well or get worse.  Document Released: 07/17/2008 Document Revised: 04/09/2011 Document Reviewed: 07/17/2008 [employer] Patient Information 2012 [employer]. 

## 2011-12-22 ENCOUNTER — Telehealth: Payer: Self-pay | Admitting: Obstetrics and Gynecology

## 2011-12-22 NOTE — Telephone Encounter (Signed)
Pt called has had scant red blding today ,not wearing pad or liner, no bld when she wipes, had urinary freq this weekend but not today,Blding is like 2 weeks ago when she had a yeast infection. Consult he patient was counseled on thwith DDavies CNM ok to schedule eval in am 8/21/13am,  . Call if increase in blding or other concerns DFaulconerRN

## 2011-12-22 NOTE — Telephone Encounter (Deleted)
Pt called has had scant red blding today ,not wearing pad or liner, no bld when she wipes, had urinary freq this weekend but not today,Blding is like 2 weeks ago when she had a yeast infection. Consult with DDavies CNM ok to schedule eval in am 12/23/11 8:45a The patient was counseled on the dangers of tobacco use, and was {advised/reluctant:18673::"advised to quit"}.  Reviewed strategies to maximize success, including {techniques:18674}. Call if increase in blding or other concerns DFaulconerRN

## 2011-12-23 ENCOUNTER — Ambulatory Visit (INDEPENDENT_AMBULATORY_CARE_PROVIDER_SITE_OTHER): Payer: BC Managed Care – HMO

## 2011-12-23 VITALS — BP 100/66 | Wt 178.5 lb

## 2011-12-23 DIAGNOSIS — Z331 Pregnant state, incidental: Secondary | ICD-10-CM

## 2011-12-23 LAB — POCT URINALYSIS DIPSTICK
Bilirubin, UA: NEGATIVE
Protein, UA: NEGATIVE

## 2011-12-23 NOTE — Progress Notes (Signed)
Pt c/o "speckled" spotting light red uti sx's

## 2011-12-23 NOTE — Progress Notes (Signed)
[redacted]w[redacted]d c/o "speckled" d/c yesterday; none today; no recent IC.  No UTI s/s.  .. Results for orders placed in visit on 12/23/11 (from the past 24 hour(s))  POCT URINALYSIS DIPSTICK     Status: Normal   Collection Time   12/23/11  9:33 AM      Component Value Range   Color, UA yellow     Clarity, UA clear     Glucose, UA neg     Bilirubin, UA neg     Ketones, UA neg     Spec Grav, UA 1.010     Blood, UA neg     pH, UA 6.0     Protein, UA neg     Urobilinogen, UA negative     Nitrite, UA neg     Leukocytes, UA Negative    cx:  Closed/long/softening. GFM.  Rev'd PTL s/s and bleeding precautions.   Also c/o an "ocular migraine" at work yesterday; spontaneously resolved after about 20 min, but headache continued.  Sinuses have been bothering her as well.  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  C/o back pain and especially "catches" when she is supine. Spec exam:  White leukorrhea, no whiff, no blood.  Disc'd probiotic b/c h/o frequent yeast vaginitis. 1hr gtt NV and offer TSH, but pt reports hasn't been checked in extended time and not on any meds.

## 2012-01-11 ENCOUNTER — Ambulatory Visit (INDEPENDENT_AMBULATORY_CARE_PROVIDER_SITE_OTHER): Payer: BC Managed Care – HMO

## 2012-01-11 ENCOUNTER — Ambulatory Visit (INDEPENDENT_AMBULATORY_CARE_PROVIDER_SITE_OTHER): Payer: BC Managed Care – HMO | Admitting: Obstetrics and Gynecology

## 2012-01-11 ENCOUNTER — Encounter: Payer: Self-pay | Admitting: Obstetrics and Gynecology

## 2012-01-11 VITALS — BP 100/62 | Wt 180.0 lb

## 2012-01-11 DIAGNOSIS — Z331 Pregnant state, incidental: Secondary | ICD-10-CM

## 2012-01-11 DIAGNOSIS — O358XX0 Maternal care for other (suspected) fetal abnormality and damage, not applicable or unspecified: Secondary | ICD-10-CM

## 2012-01-11 LAB — US OB FOLLOW UP

## 2012-01-11 NOTE — Progress Notes (Signed)
1 gtt given w/o difficulty

## 2012-01-11 NOTE — Progress Notes (Signed)
[redacted]w[redacted]d  1hr GTT today. Discussed BH CTX No complaints No change vaginal secretions F/u Anatomy USS today. Result: Vx presentation.              Anterior Placenta              Normal Fluid.              Normal growth              Cardiac views seen today - normal with no late presenting fetal abnormalities.              Cx closed, Normal adnexa/ovaries. ROB x 2 weeks

## 2012-01-25 ENCOUNTER — Ambulatory Visit (INDEPENDENT_AMBULATORY_CARE_PROVIDER_SITE_OTHER): Payer: BC Managed Care – HMO | Admitting: Obstetrics and Gynecology

## 2012-01-25 ENCOUNTER — Encounter: Payer: Self-pay | Admitting: Obstetrics and Gynecology

## 2012-01-25 VITALS — BP 102/60 | Wt 181.0 lb

## 2012-01-25 DIAGNOSIS — Z331 Pregnant state, incidental: Secondary | ICD-10-CM

## 2012-01-25 DIAGNOSIS — R7309 Other abnormal glucose: Secondary | ICD-10-CM

## 2012-01-25 DIAGNOSIS — O99810 Abnormal glucose complicating pregnancy: Secondary | ICD-10-CM

## 2012-01-25 MED ORDER — PRAMOXINE HCL 1 % RE FOAM: RECTAL | Status: DC | PRN

## 2012-01-25 MED ORDER — HYDROCODONE-ACETAMINOPHEN 5-500 MG PO TABS: 1.0000 | ORAL_TABLET | Freq: Four times a day (QID) | ORAL | Status: DC | PRN

## 2012-01-25 NOTE — Patient Instructions (Signed)
Hemorrhoids Hemorrhoids are enlarged (dilated) veins around the rectum. There are 2 types of hemorrhoids, and the type of hemorrhoid is determined by its location. Internal hemorrhoids occur in the veins just inside the rectum.They are usually not painful, but they may bleed.However, they may poke through to the outside and become irritated and painful. External hemorrhoids involve the veins outside the anus and can be felt as a painful swelling or hard lump near the anus.They are often itchy and may crack and bleed. Sometimes clots will form in the veins. This makes them swollen and painful. These are called thrombosed hemorrhoids. CAUSES Causes of hemorrhoids include:  Pregnancy. This increases the pressure in the hemorrhoidal veins.   Constipation.   Straining to have a bowel movement.   Obesity.   Heavy lifting or other activity that caused you to strain.  TREATMENT Most of the time hemorrhoids improve in 1 to 2 weeks. However, if symptoms do not seem to be getting better or if you have a lot of rectal bleeding, your caregiver may perform a procedure to help make the hemorrhoids get smaller or remove them completely.Possible treatments include:  Rubber band ligation. A rubber band is placed at the base of the hemorrhoid to cut off the circulation.   Sclerotherapy. A chemical is injected to shrink the hemorrhoid.   Infrared light therapy. Tools are used to burn the hemorrhoid.   Hemorrhoidectomy. This is surgical removal of the hemorrhoid.  HOME CARE INSTRUCTIONS   Increase fiber in your diet. Ask your caregiver about using fiber supplements.   Drink enough water and fluids to keep your urine clear or pale yellow.   Exercise regularly.   Go to the bathroom when you have the urge to have a bowel movement. Do not wait.   Avoid straining to have bowel movements.   Keep the anal area dry and clean.   Only take over-the-counter or prescription medicines for pain, discomfort,  or fever as directed by your caregiver.  If your hemorrhoids are thrombosed:  Take warm sitz baths for 20 to 30 minutes, 3 to 4 times per day.   If the hemorrhoids are very tender and swollen, place ice packs on the area as tolerated. Using ice packs between sitz baths may be helpful. Fill a plastic bag with ice. Place a towel between the bag of ice and your skin.   Medicated creams and suppositories may be used or applied as directed.   Do not use a donut-shaped pillow or sit on the toilet for long periods. This increases blood pooling and pain.  SEEK MEDICAL CARE IF:   You have increasing pain and swelling that is not controlled with your medicine.   You have uncontrolled bleeding.   You have difficulty or you are unable to have a bowel movement.   You have pain or inflammation outside the area of the hemorrhoids.   You have chills or an oral temperature above 102 F (38.9 C).  MAKE SURE YOU:   Understand these instructions.   Will watch your condition.   Will get help right away if you are not doing well or get worse.  Document Released: 04/17/2000 Document Revised: 04/09/2011 Document Reviewed: 08/23/2007 [facility] Patient Information 2012 [place], [place]. Fetal Movement Counts Patient Name: __________________________________________________ Patient Due Date: ____________________ Melody Haver counts is highly recommended in high risk pregnancies, but it is a good idea for every pregnant woman to do. Start counting fetal movements at 28 weeks of the pregnancy. Fetal movements increase after  eating a full meal or eating or drinking something sweet (the blood sugar is higher). It is also important to drink plenty of fluids (well hydrated) before doing the count. Lie on your left side because it helps with the circulation or you can sit in a comfortable chair with your arms over your belly (abdomen) with no distractions around you. DOING THE COUNT  Try to do the count the same time of day  each time you do it.   Mark the day and time, then see how long it takes for you to feel 10 movements (kicks, flutters, swishes, rolls). You should have at least 10 movements within 2 hours. You will most likely feel 10 movements in much less than 2 hours. If you do not, wait an hour and count again. After a couple of days you will see a pattern.   What you are looking for is a change in the pattern or not enough counts in 2 hours. Is it taking longer in time to reach 10 movements?  SEEK MEDICAL CARE IF:  You feel less than 10 counts in 2 hours. Tried twice.   No movement in one hour.   The pattern is changing or taking longer each day to reach 10 counts in 2 hours.   You feel the baby is not moving as it usually does.  Date: ____________ Movements: ____________ Start time: ____________ Doreatha Martin time: ____________  Date: ____________ Movements: ____________ Start time: ____________ Doreatha Martin time: ____________ Date: ____________ Movements: ____________ Start time: ____________ Doreatha Martin time: ____________ Date: ____________ Movements: ____________ Start time: ____________ Doreatha Martin time: ____________ Date: ____________ Movements: ____________ Start time: ____________ Doreatha Martin time: ____________ Date: ____________ Movements: ____________ Start time: ____________ Doreatha Martin time: ____________ Date: ____________ Movements: ____________ Start time: ____________ Doreatha Martin time: ____________ Date: ____________ Movements: ____________ Start time: ____________ Doreatha Martin time: ____________  Date: ____________ Movements: ____________ Start time: ____________ Doreatha Martin time: ____________ Date: ____________ Movements: ____________ Start time: ____________ Doreatha Martin time: ____________ Date: ____________ Movements: ____________ Start time: ____________ Doreatha Martin time: ____________ Date: ____________ Movements: ____________ Start time: ____________ Doreatha Martin time: ____________ Date: ____________ Movements: ____________ Start time:  ____________ Doreatha Martin time: ____________ Date: ____________ Movements: ____________ Start time: ____________ Doreatha Martin time: ____________ Date: ____________ Movements: ____________ Start time: ____________ Doreatha Martin time: ____________  Date: ____________ Movements: ____________ Start time: ____________ Doreatha Martin time: ____________ Date: ____________ Movements: ____________ Start time: ____________ Doreatha Martin time: ____________ Date: ____________ Movements: ____________ Start time: ____________ Doreatha Martin time: ____________ Date: ____________ Movements: ____________ Start time: ____________ Doreatha Martin time: ____________ Date: ____________ Movements: ____________ Start time: ____________ Doreatha Martin time: ____________ Date: ____________ Movements: ____________ Start time: ____________ Doreatha Martin time: ____________ Date: ____________ Movements: ____________ Start time: ____________ Doreatha Martin time: ____________  Date: ____________ Movements: ____________ Start time: ____________ Doreatha Martin time: ____________ Date: ____________ Movements: ____________ Start time: ____________ Doreatha Martin time: ____________ Date: ____________ Movements: ____________ Start time: ____________ Doreatha Martin time: ____________ Date: ____________ Movements: ____________ Start time: ____________ Doreatha Martin time: ____________ Date: ____________ Movements: ____________ Start time: ____________ Doreatha Martin time: ____________ Date: ____________ Movements: ____________ Start time: ____________ Doreatha Martin time: ____________ Date: ____________ Movements: ____________ Start time: ____________ Doreatha Martin time: ____________  Date: ____________ Movements: ____________ Start time: ____________ Doreatha Martin time: ____________ Date: ____________ Movements: ____________ Start time: ____________ Doreatha Martin time: ____________ Date: ____________ Movements: ____________ Start time: ____________ Doreatha Martin time: ____________ Date: ____________ Movements: ____________ Start time: ____________ Doreatha Martin time: ____________ Date:  ____________ Movements: ____________ Start time: ____________ Doreatha Martin time: ____________ Date: ____________ Movements: ____________ Start time: ____________ Doreatha Martin time: ____________ Date: ____________ Movements: ____________ Start time: ____________ Doreatha Martin time: ____________  Date: ____________ Movements: ____________ Start time: ____________ Doreatha Martin time: ____________ Date: ____________ Movements: ____________ Start time: ____________ Doreatha Martin time: ____________ Date: ____________ Movements: ____________ Start time: ____________ Doreatha Martin time: ____________ Date: ____________ Movements: ____________ Start time: ____________ Doreatha Martin time: ____________ Date: ____________ Movements: ____________ Start time: ____________ Doreatha Martin time: ____________ Date: ____________ Movements: ____________ Start time: ____________ Doreatha Martin time: ____________ Date: ____________ Movements: ____________ Start time: ____________ Doreatha Martin time: ____________  Date: ____________ Movements: ____________ Start time: ____________ Doreatha Martin time: ____________ Date: ____________ Movements: ____________ Start time: ____________ Doreatha Martin time: ____________ Date: ____________ Movements: ____________ Start time: ____________ Doreatha Martin time: ____________ Date: ____________ Movements: ____________ Start time: ____________ Doreatha Martin time: ____________ Date: ____________ Movements: ____________ Start time: ____________ Doreatha Martin time: ____________ Date: ____________ Movements: ____________ Start time: ____________ Doreatha Martin time: ____________ Date: ____________ Movements: ____________ Start time: ____________ Doreatha Martin time: ____________  Date: ____________ Movements: ____________ Start time: ____________ Doreatha Martin time: ____________ Date: ____________ Movements: ____________ Start time: ____________ Doreatha Martin time: ____________ Date: ____________ Movements: ____________ Start time: ____________ Doreatha Martin time: ____________ Date: ____________ Movements: ____________ Start  time: ____________ Doreatha Martin time: ____________ Date: ____________ Movements: ____________ Start time: ____________ Doreatha Martin time: ____________ Date: ____________ Movements: ____________ Start time: ____________ Doreatha Martin time: ____________ Document Released: 05/20/2006 Document Revised: 04/09/2011 Document Reviewed: 11/20/2008 [employer] Patient Information 2012 [employer]. Preterm Labor Preterm labor is when labor starts at less than 37 weeks of pregnancy. The normal length of a pregnancy is 39 to 41 weeks. CAUSES Often, there is no identifiable underlying cause as to why a woman goes into preterm labor. However, one of the most common known causes of preterm labor is infection. Infections of the uterus, cervix, vagina, amniotic sac, bladder, kidney, or even the lungs (pneumonia) can cause labor to start. Other causes of preterm labor include:  Urogenital infections, such as yeast infections and bacterial vaginosis.   Uterine abnormalities (uterine shape, uterine septum, fibroids, bleeding from the placenta).   A cervix that has been operated on and opens prematurely.   Malformations in the baby.   Multiple gestations (twins, triplets, and so on).   Breakage of the amniotic sac.  Additional risk factors for preterm labor include:  Previous history of preterm labor.   Premature rupture of membranes (PROM).   A placenta that covers the opening of the cervix (placenta previa).   A placenta that separates from the uterus (placenta abruption).   A cervix that is too weak to hold the baby in the uterus (incompetence cervix).   Having too much fluid in the amniotic sac (polyhydramnios).   Taking illegal drugs or smoking while pregnant.   Not gaining enough weight while pregnant.   Women younger than 79 and older than 34 years old.   Low socioeconomic status.   African-American ethnicity.  SYMPTOMS Signs and symptoms of preterm labor include:  Menstrual-like cramps.    Contractions that are 30 to 70 seconds apart, become very regular, closer together, and are more intense and painful.   Contractions that start on the top of the uterus and spread down to the lower abdomen and back.   A sense of increased pelvic pressure or back pain.   A watery or bloody discharge that comes from the vagina.  DIAGNOSIS  A diagnosis can be confirmed by:  A vaginal exam.   An ultrasound of the cervix.   Sampling (swabbing) cervico-vaginal secretions. These samples can be tested for the presence of fetal fibronectin. This is a protein found in cervical discharge which is associated with preterm labor.   Fetal monitoring.  TREATMENT  Depending on the length of the pregnancy and other circumstances, a caregiver may suggest bed rest. If necessary, there are medicines that can be given to stop contractions and to quicken fetal lung maturity. If labor happens before 34 weeks of pregnancy, a prolonged hospital stay may be recommended. Treatment depends on the condition of both the mother and baby. PREVENTION There are some things a mother can do to lower the risk of preterm labor in future pregnancies. A woman can:   Stop smoking.   Maintain healthy weight gain and avoid chemicals and drugs that are not necessary.   Be watchful for any type of infection.   Inform her caregiver if she has a known history of preterm labor.  Document Released: 07/11/2003 Document Revised: 04/09/2011 Document Reviewed: 08/15/2010 [facility] Patient Information 2012 [place], [place].

## 2012-01-25 NOTE — Progress Notes (Signed)
[redacted]w[redacted]d Pt states she have hemorrhoids and want to know what to do. No other problems.

## 2012-01-25 NOTE — Progress Notes (Signed)
[redacted]w[redacted]d rx protocofoam, rv'd comfort measures 1hr gtt =139, will schedule for 3hr gtt Pt gets migraines w 3hr, and fiorcet doesn't help, will rx vicodin  Needs TSH drawn NV rv'd PTL and [facility] RTO 2wks

## 2012-02-02 ENCOUNTER — Telehealth: Payer: Self-pay | Admitting: Obstetrics and Gynecology

## 2012-02-02 ENCOUNTER — Ambulatory Visit (INDEPENDENT_AMBULATORY_CARE_PROVIDER_SITE_OTHER): Payer: BC Managed Care – HMO | Admitting: Obstetrics and Gynecology

## 2012-02-02 VITALS — BP 122/70

## 2012-02-02 DIAGNOSIS — O368190 Decreased fetal movements, unspecified trimester, not applicable or unspecified: Secondary | ICD-10-CM

## 2012-02-02 NOTE — Progress Notes (Signed)
Pt c/o diarrhea for past 2-3 days, states does feel better today after she had some lunch and 34 year old daughter has had a cold.

## 2012-02-02 NOTE — Telephone Encounter (Signed)
TC to pt.  States has noticed decrease and less strong FM x a few days.  Per Dr Alinda Sierras to office for NST.

## 2012-02-04 LAB — GLUCOSE TOLERANCE, 3 HOURS
Glucose Tolerance, 1 hour: 162 mg/dL (ref 70–189)
Glucose Tolerance, Fasting: 80 mg/dL (ref 70–104)

## 2012-02-06 ENCOUNTER — Encounter: Payer: Self-pay | Admitting: Obstetrics and Gynecology

## 2012-02-08 ENCOUNTER — Encounter: Payer: Self-pay | Admitting: Obstetrics and Gynecology

## 2012-02-08 DIAGNOSIS — Z331 Pregnant state, incidental: Secondary | ICD-10-CM | POA: Insufficient documentation

## 2012-02-11 ENCOUNTER — Encounter: Payer: Self-pay | Admitting: Obstetrics and Gynecology

## 2012-02-11 ENCOUNTER — Ambulatory Visit (INDEPENDENT_AMBULATORY_CARE_PROVIDER_SITE_OTHER): Payer: BC Managed Care – HMO | Admitting: Obstetrics and Gynecology

## 2012-02-11 VITALS — BP 104/62 | Wt 181.0 lb

## 2012-02-11 DIAGNOSIS — O26849 Uterine size-date discrepancy, unspecified trimester: Secondary | ICD-10-CM

## 2012-02-11 DIAGNOSIS — Z331 Pregnant state, incidental: Secondary | ICD-10-CM

## 2012-02-11 NOTE — Addendum Note (Signed)
Addended by: Darien Ramus on: 02/11/2012 11:22 AM   Modules accepted: Orders

## 2012-02-11 NOTE — Progress Notes (Signed)
Patient ID: Sherry Johnston, female   DOB: January 29, 1976, 36 y.o.   MRN: [id number] [redacted]w[redacted]d F/O [place] growth for IVF Reviewed s/s preterm labor, srom, vag bleeding,daily kick counts to report, encouraged 8 water daily and frequent voids. Lavera Guise, CNM

## 2012-02-11 NOTE — Progress Notes (Signed)
[redacted]w[redacted]d C/O loss of appetite

## 2012-02-25 ENCOUNTER — Ambulatory Visit (INDEPENDENT_AMBULATORY_CARE_PROVIDER_SITE_OTHER): Payer: BC Managed Care – HMO

## 2012-02-25 ENCOUNTER — Other Ambulatory Visit: Payer: Self-pay | Admitting: Obstetrics and Gynecology

## 2012-02-25 ENCOUNTER — Ambulatory Visit (INDEPENDENT_AMBULATORY_CARE_PROVIDER_SITE_OTHER): Payer: BC Managed Care – HMO | Admitting: Obstetrics and Gynecology

## 2012-02-25 VITALS — BP 108/64 | Wt 181.0 lb

## 2012-02-25 DIAGNOSIS — O26849 Uterine size-date discrepancy, unspecified trimester: Secondary | ICD-10-CM

## 2012-02-25 DIAGNOSIS — Z331 Pregnant state, incidental: Secondary | ICD-10-CM

## 2012-02-25 DIAGNOSIS — Z23 Encounter for immunization: Secondary | ICD-10-CM | POA: Insufficient documentation

## 2012-02-25 NOTE — Progress Notes (Signed)
Pt stated been having some right side pain. C/o diarrhea 3 times a day . Flu shot given today .

## 2012-02-25 NOTE — Progress Notes (Signed)
[redacted]w[redacted]d Ultrasound: 4 lbs 14 oz   64%             AFI normal   Cervix 3.15 cm

## 2012-02-26 LAB — US OB FOLLOW UP

## 2012-03-10 ENCOUNTER — Ambulatory Visit (INDEPENDENT_AMBULATORY_CARE_PROVIDER_SITE_OTHER): Payer: BC Managed Care – HMO | Admitting: Obstetrics and Gynecology

## 2012-03-10 ENCOUNTER — Encounter: Payer: Self-pay | Admitting: Obstetrics and Gynecology

## 2012-03-10 VITALS — BP 102/68 | Wt 184.0 lb

## 2012-03-10 DIAGNOSIS — Z331 Pregnant state, incidental: Secondary | ICD-10-CM

## 2012-03-10 LAB — OB RESULTS CONSOLE GBS: GBS: NEGATIVE

## 2012-03-10 NOTE — Progress Notes (Signed)
Pt c/o a stabbing pain deep in vaginal area

## 2012-03-10 NOTE — Progress Notes (Signed)
A/P GBS done today speculum exam.  Normal v/v and cevix Fetal kick counts reviewed Labor reviewed with pt All patients  questions answered

## 2012-03-10 NOTE — Patient Instructions (Signed)
Fetal Movement Counts Patient Name: __________________________________________________ Patient Due Date: ____________________ Kick counts is highly recommended in high risk pregnancies, but it is a good idea for every pregnant woman to do. Start counting fetal movements at 28 weeks of the pregnancy. Fetal movements increase after eating a full meal or eating or drinking something sweet (the blood sugar is higher). It is also important to drink plenty of fluids (well hydrated) before doing the count. Lie on your left side because it helps with the circulation or you can sit in a comfortable chair with your arms over your belly (abdomen) with no distractions around you. DOING THE COUNT  Try to do the count the same time of day each time you do it.  Mark the day and time, then see how long it takes for you to feel 10 movements (kicks, flutters, swishes, rolls). You should have at least 10 movements within 2 hours. You will most likely feel 10 movements in much less than 2 hours. If you do not, wait an hour and count again. After a couple of days you will see a pattern.  What you are looking for is a change in the pattern or not enough counts in 2 hours. Is it taking longer in time to reach 10 movements? SEEK MEDICAL CARE IF:  You feel less than 10 counts in 2 hours. Tried twice.  No movement in one hour.  The pattern is changing or taking longer each day to reach 10 counts in 2 hours.  You feel the baby is not moving as it usually does. Date: ____________ Movements: ____________ Start time: ____________ Finish time: ____________  Date: ____________ Movements: ____________ Start time: ____________ Finish time: ____________ Date: ____________ Movements: ____________ Start time: ____________ Finish time: ____________ Date: ____________ Movements: ____________ Start time: ____________ Finish time: ____________ Date: ____________ Movements: ____________ Start time: ____________ Finish time:  ____________ Date: ____________ Movements: ____________ Start time: ____________ Finish time: ____________ Date: ____________ Movements: ____________ Start time: ____________ Finish time: ____________ Date: ____________ Movements: ____________ Start time: ____________ Finish time: ____________  Date: ____________ Movements: ____________ Start time: ____________ Finish time: ____________ Date: ____________ Movements: ____________ Start time: ____________ Finish time: ____________ Date: ____________ Movements: ____________ Start time: ____________ Finish time: ____________ Date: ____________ Movements: ____________ Start time: ____________ Finish time: ____________ Date: ____________ Movements: ____________ Start time: ____________ Finish time: ____________ Date: ____________ Movements: ____________ Start time: ____________ Finish time: ____________ Date: ____________ Movements: ____________ Start time: ____________ Finish time: ____________  Date: ____________ Movements: ____________ Start time: ____________ Finish time: ____________ Date: ____________ Movements: ____________ Start time: ____________ Finish time: ____________ Date: ____________ Movements: ____________ Start time: ____________ Finish time: ____________ Date: ____________ Movements: ____________ Start time: ____________ Finish time: ____________ Date: ____________ Movements: ____________ Start time: ____________ Finish time: ____________ Date: ____________ Movements: ____________ Start time: ____________ Finish time: ____________ Date: ____________ Movements: ____________ Start time: ____________ Finish time: ____________  Date: ____________ Movements: ____________ Start time: ____________ Finish time: ____________ Date: ____________ Movements: ____________ Start time: ____________ Finish time: ____________ Date: ____________ Movements: ____________ Start time: ____________ Finish time: ____________ Date: ____________ Movements:  ____________ Start time: ____________ Finish time: ____________ Date: ____________ Movements: ____________ Start time: ____________ Finish time: ____________ Date: ____________ Movements: ____________ Start time: ____________ Finish time: ____________ Date: ____________ Movements: ____________ Start time: ____________ Finish time: ____________  Date: ____________ Movements: ____________ Start time: ____________ Finish time: ____________ Date: ____________ Movements: ____________ Start time: ____________ Finish time: ____________ Date: ____________ Movements: ____________ Start time: ____________ Finish time: ____________ Date: ____________ Movements:   ____________ Start time: ____________ Finish time: ____________ Date: ____________ Movements: ____________ Start time: ____________ Finish time: ____________ Date: ____________ Movements: ____________ Start time: ____________ Finish time: ____________ Date: ____________ Movements: ____________ Start time: ____________ Finish time: ____________  Date: ____________ Movements: ____________ Start time: ____________ Finish time: ____________ Date: ____________ Movements: ____________ Start time: ____________ Finish time: ____________ Date: ____________ Movements: ____________ Start time: ____________ Finish time: ____________ Date: ____________ Movements: ____________ Start time: ____________ Finish time: ____________ Date: ____________ Movements: ____________ Start time: ____________ Finish time: ____________ Date: ____________ Movements: ____________ Start time: ____________ Finish time: ____________ Date: ____________ Movements: ____________ Start time: ____________ Finish time: ____________  Date: ____________ Movements: ____________ Start time: ____________ Finish time: ____________ Date: ____________ Movements: ____________ Start time: ____________ Finish time: ____________ Date: ____________ Movements: ____________ Start time: ____________ Finish  time: ____________ Date: ____________ Movements: ____________ Start time: ____________ Finish time: ____________ Date: ____________ Movements: ____________ Start time: ____________ Finish time: ____________ Date: ____________ Movements: ____________ Start time: ____________ Finish time: ____________ Date: ____________ Movements: ____________ Start time: ____________ Finish time: ____________  Date: ____________ Movements: ____________ Start time: ____________ Finish time: ____________ Date: ____________ Movements: ____________ Start time: ____________ Finish time: ____________ Date: ____________ Movements: ____________ Start time: ____________ Finish time: ____________ Date: ____________ Movements: ____________ Start time: ____________ Finish time: ____________ Date: ____________ Movements: ____________ Start time: ____________ Finish time: ____________ Date: ____________ Movements: ____________ Start time: ____________ Finish time: ____________ Document Released: 05/20/2006 Document Revised: 07/13/2011 Document Reviewed: 11/20/2008 [employer] Patient Information 2013 [employer].  

## 2012-03-13 LAB — CULTURE, BETA STREP (GROUP B ONLY)

## 2012-03-18 ENCOUNTER — Ambulatory Visit (INDEPENDENT_AMBULATORY_CARE_PROVIDER_SITE_OTHER): Payer: BC Managed Care – HMO | Admitting: Obstetrics and Gynecology

## 2012-03-18 VITALS — BP 114/60 | Wt 182.0 lb

## 2012-03-18 DIAGNOSIS — Z331 Pregnant state, incidental: Secondary | ICD-10-CM

## 2012-03-18 NOTE — Progress Notes (Signed)
[redacted]w[redacted]d  GFM GBS negative

## 2012-03-18 NOTE — Progress Notes (Signed)
[redacted]w[redacted]d GBS negative Increased pressure.

## 2012-03-22 ENCOUNTER — Telehealth: Payer: Self-pay | Admitting: Obstetrics and Gynecology

## 2012-03-22 NOTE — Telephone Encounter (Signed)
Spoke with pt. Pt c/o swelling and tightness in feet and ankles denies any other sx's. Informed pt she needs to sit with her feet above her heart, drink 10-12 glasses of water, take B6 100 mg to decrease fluid retention & make sure she includes proteins (chicken, fish, beans, etc) in her diet. Pt also informed to keep appt with SR on Thurs at 11:20 am & to call us before this appt if she experiences visual disturbances or headaches. Pt agrees and voices understanding.

## 2012-03-24 ENCOUNTER — Ambulatory Visit (INDEPENDENT_AMBULATORY_CARE_PROVIDER_SITE_OTHER): Payer: BC Managed Care – HMO | Admitting: Obstetrics and Gynecology

## 2012-03-24 VITALS — BP 110/64 | Wt 185.0 lb

## 2012-03-24 DIAGNOSIS — Z331 Pregnant state, incidental: Secondary | ICD-10-CM

## 2012-03-24 LAB — POCT FERN TEST: POCT Fern Test: NEGATIVE

## 2012-03-24 LAB — POCT NITRAZINE TEST: POCT Nitrazine (amniosure): NEGATIVE

## 2012-03-24 NOTE — Progress Notes (Signed)
[redacted]w[redacted]d Pt questioning if leaking. Feels "drippy".  Noticed change in d/c. Ankles swelling slightly.

## 2012-03-24 NOTE — Progress Notes (Signed)
[redacted]w[redacted]d  GFM GBS negative Nitrazine negative. Fern negative

## 2012-03-30 ENCOUNTER — Ambulatory Visit (INDEPENDENT_AMBULATORY_CARE_PROVIDER_SITE_OTHER): Payer: BC Managed Care – HMO | Admitting: Obstetrics and Gynecology

## 2012-03-30 ENCOUNTER — Encounter: Payer: Self-pay | Admitting: Obstetrics and Gynecology

## 2012-03-30 VITALS — BP 110/60 | Wt 188.0 lb

## 2012-03-30 DIAGNOSIS — Z331 Pregnant state, incidental: Secondary | ICD-10-CM

## 2012-03-30 NOTE — Progress Notes (Signed)
[redacted]w[redacted]d  Pt has lightheadedness and nausea just this am

## 2012-03-30 NOTE — Progress Notes (Signed)
[redacted]w[redacted]d GFM

## 2012-04-04 ENCOUNTER — Other Ambulatory Visit: Payer: Self-pay | Admitting: Obstetrics and Gynecology

## 2012-04-04 ENCOUNTER — Ambulatory Visit (INDEPENDENT_AMBULATORY_CARE_PROVIDER_SITE_OTHER): Payer: BC Managed Care – HMO | Admitting: Obstetrics and Gynecology

## 2012-04-04 ENCOUNTER — Telehealth: Payer: Self-pay | Admitting: Obstetrics and Gynecology

## 2012-04-04 ENCOUNTER — Ambulatory Visit (INDEPENDENT_AMBULATORY_CARE_PROVIDER_SITE_OTHER): Payer: BC Managed Care – HMO

## 2012-04-04 VITALS — BP 110/62 | Wt 183.0 lb

## 2012-04-04 DIAGNOSIS — O368190 Decreased fetal movements, unspecified trimester, not applicable or unspecified: Secondary | ICD-10-CM

## 2012-04-04 DIAGNOSIS — Z331 Pregnant state, incidental: Secondary | ICD-10-CM

## 2012-04-04 NOTE — Progress Notes (Signed)
Ultrasound shows:  SIUP  S=D     [place] EDD: 04/13/12            AFI: 70th % tile            Placenta localization: anterior           Fetal presentation: vertex           BPP: 8/8 Comments: vertex presentation,anterior placenta,afi is normal (70th%)                      BPP:8/8

## 2012-04-04 NOTE — Progress Notes (Signed)
[redacted]w[redacted]d  Pt desires cervix check

## 2012-04-04 NOTE — Telephone Encounter (Signed)
Pt called, is 38w 5d, pt states has had decreased fetal movement this am.  Has felt baby move about 3 times today after eating breakfast and drinking.  Pt worked in with AVS today @ 0915 today for eval.

## 2012-04-04 NOTE — Progress Notes (Signed)
[redacted]w[redacted]d Complains of decreased fetal movement. Ultrasound: Single gestation, vertex, normal fluid, biophysical profile 8 out of 8. Return to office in 1 week. Dr. Stefano Gaul

## 2012-04-06 ENCOUNTER — Ambulatory Visit: Payer: BC Managed Care – HMO | Admitting: Obstetrics and Gynecology

## 2012-04-11 ENCOUNTER — Encounter: Payer: Self-pay | Admitting: Obstetrics and Gynecology

## 2012-04-11 ENCOUNTER — Ambulatory Visit (INDEPENDENT_AMBULATORY_CARE_PROVIDER_SITE_OTHER): Payer: BC Managed Care – HMO | Admitting: Obstetrics and Gynecology

## 2012-04-11 VITALS — BP 128/70 | Wt 183.0 lb

## 2012-04-11 DIAGNOSIS — Z331 Pregnant state, incidental: Secondary | ICD-10-CM

## 2012-04-11 NOTE — Progress Notes (Signed)
[redacted]w[redacted]d Request Cervix Check

## 2012-04-11 NOTE — Progress Notes (Signed)
[redacted]w[redacted]d Doing well. Induction at 41 weeks. Dr. Stefano Gaul

## 2012-04-12 ENCOUNTER — Telehealth: Payer: Self-pay | Admitting: Obstetrics and Gynecology

## 2012-04-12 NOTE — Telephone Encounter (Signed)
Induction scheduled for 04/20/12 @ 7:30am with AR/SL. Sherry Johnston

## 2012-04-13 ENCOUNTER — Encounter (HOSPITAL_COMMUNITY): Payer: Self-pay | Admitting: *Deleted

## 2012-04-13 ENCOUNTER — Telehealth (HOSPITAL_COMMUNITY): Payer: Self-pay | Admitting: *Deleted

## 2012-04-13 NOTE — Telephone Encounter (Signed)
Preadmission screen  

## 2012-04-14 ENCOUNTER — Inpatient Hospital Stay (HOSPITAL_COMMUNITY)
Admission: AD | Admit: 2012-04-14 | Discharge: 2012-04-14 | Disposition: A | Discharge status: Home / Self Care | Payer: BC Managed Care – PPO | Source: Ambulatory Visit | Attending: Obstetrics and Gynecology | Admitting: Obstetrics and Gynecology

## 2012-04-14 ENCOUNTER — Encounter (HOSPITAL_COMMUNITY): Payer: Self-pay | Admitting: *Deleted

## 2012-04-14 DIAGNOSIS — O479 False labor, unspecified: Secondary | ICD-10-CM | POA: Insufficient documentation

## 2012-04-14 NOTE — MAU Note (Signed)
Pt G5 P1 at 40.1wks having contractions every 2-66min.

## 2012-04-18 ENCOUNTER — Ambulatory Visit (INDEPENDENT_AMBULATORY_CARE_PROVIDER_SITE_OTHER): Payer: BC Managed Care – HMO | Admitting: Obstetrics and Gynecology

## 2012-04-18 ENCOUNTER — Telehealth: Payer: Self-pay | Admitting: Obstetrics and Gynecology

## 2012-04-18 ENCOUNTER — Inpatient Hospital Stay (HOSPITAL_COMMUNITY)
Admission: AD | Admit: 2012-04-18 | Discharge: 2012-04-20 | DRG: 373 | Disposition: A | Discharge status: Home / Self Care | Payer: BC Managed Care – PPO | Source: Ambulatory Visit | Attending: Obstetrics and Gynecology | Admitting: Obstetrics and Gynecology

## 2012-04-18 VITALS — BP 106/78 | Wt 183.0 lb

## 2012-04-18 DIAGNOSIS — N979 Female infertility, unspecified: Secondary | ICD-10-CM

## 2012-04-18 DIAGNOSIS — Z331 Pregnant state, incidental: Secondary | ICD-10-CM

## 2012-04-18 DIAGNOSIS — Z23 Encounter for immunization: Secondary | ICD-10-CM

## 2012-04-18 DIAGNOSIS — IMO0001 Reserved for inherently not codable concepts without codable children: Secondary | ICD-10-CM

## 2012-04-18 DIAGNOSIS — Z8759 Personal history of other complications of pregnancy, childbirth and the puerperium: Secondary | ICD-10-CM

## 2012-04-18 DIAGNOSIS — R7309 Other abnormal glucose: Secondary | ICD-10-CM

## 2012-04-18 LAB — CBC
Hemoglobin: 10.9 g/dL — ABNORMAL LOW (ref 12.0–15.0)
RBC: 4.13 MIL/uL (ref 3.87–5.11)

## 2012-04-18 MED ORDER — ONDANSETRON HCL 4 MG/2ML IJ SOLN: 4.0000 mg | Freq: Four times a day (QID) | INTRAMUSCULAR | Status: DC | PRN

## 2012-04-18 MED ORDER — CITRIC ACID-SODIUM CITRATE 334-500 MG/5ML PO SOLN: 30.0000 mL | ORAL | Status: DC | PRN

## 2012-04-18 MED ORDER — LACTATED RINGERS IV SOLN
INTRAVENOUS | Status: DC
  Administered 2012-04-18: via INTRAVENOUS

## 2012-04-18 MED ORDER — HYDROXYZINE HCL 50 MG PO TABS: 50.0000 mg | ORAL_TABLET | Freq: Four times a day (QID) | ORAL | Status: DC | PRN

## 2012-04-18 MED ORDER — ACETAMINOPHEN 325 MG PO TABS: 650.0000 mg | ORAL_TABLET | ORAL | Status: DC | PRN

## 2012-04-18 MED ORDER — FENTANYL 2.5 MCG/ML BUPIVACAINE 1/10 % EPIDURAL INFUSION (WH - ANES)
14.0000 mL/h | INTRAMUSCULAR | Status: DC
  Administered 2012-04-19: 14 mL/h via EPIDURAL
  Filled 2012-04-18: qty 125

## 2012-04-18 MED ORDER — LACTATED RINGERS IV SOLN: 500.0000 mL | INTRAVENOUS | Status: DC | PRN

## 2012-04-18 MED ORDER — OXYTOCIN BOLUS FROM INFUSION: 500.0000 mL | INTRAVENOUS | Status: DC

## 2012-04-18 MED ORDER — PHENYLEPHRINE 40 MCG/ML (10ML) SYRINGE FOR IV PUSH (FOR BLOOD PRESSURE SUPPORT)
80.0000 ug | PREFILLED_SYRINGE | INTRAVENOUS | Status: DC | PRN
  Filled 2012-04-18: qty 5

## 2012-04-18 MED ORDER — DIPHENHYDRAMINE HCL 50 MG/ML IJ SOLN: 12.5000 mg | INTRAMUSCULAR | Status: DC | PRN

## 2012-04-18 MED ORDER — LIDOCAINE HCL (PF) 1 % IJ SOLN
30.0000 mL | INTRAMUSCULAR | Status: DC | PRN
  Filled 2012-04-18: qty 30

## 2012-04-18 MED ORDER — HYDROXYZINE HCL 50 MG/ML IM SOLN
50.0000 mg | Freq: Four times a day (QID) | INTRAMUSCULAR | Status: DC | PRN
  Filled 2012-04-18: qty 1

## 2012-04-18 MED ORDER — OXYCODONE-ACETAMINOPHEN 5-325 MG PO TABS: 1.0000 | ORAL_TABLET | ORAL | Status: DC | PRN

## 2012-04-18 MED ORDER — PHENYLEPHRINE 40 MCG/ML (10ML) SYRINGE FOR IV PUSH (FOR BLOOD PRESSURE SUPPORT): 80.0000 ug | PREFILLED_SYRINGE | INTRAVENOUS | Status: DC | PRN

## 2012-04-18 MED ORDER — LACTATED RINGERS IV SOLN: 500.0000 mL | Freq: Once | INTRAVENOUS | Status: DC

## 2012-04-18 MED ORDER — EPHEDRINE 5 MG/ML INJ: 10.0000 mg | INTRAVENOUS | Status: DC | PRN

## 2012-04-18 MED ORDER — OXYTOCIN 40 UNITS IN LACTATED RINGERS INFUSION - SIMPLE MED
62.5000 mL/h | INTRAVENOUS | Status: DC
  Filled 2012-04-18: qty 1000

## 2012-04-18 MED ORDER — EPHEDRINE 5 MG/ML INJ
10.0000 mg | INTRAVENOUS | Status: DC | PRN
  Filled 2012-04-18: qty 4

## 2012-04-18 MED ORDER — IBUPROFEN 600 MG PO TABS: 600.0000 mg | ORAL_TABLET | Freq: Four times a day (QID) | ORAL | Status: DC | PRN

## 2012-04-18 NOTE — Telephone Encounter (Signed)
Plans epidural lives 5 minutes away, waiting for parents 1 1 /2 hours away, sounds like labor encouraged to come in for evaluation soon. Lavera Guise, CNM

## 2012-04-18 NOTE — Telephone Encounter (Signed)
Pt called regarding msg below.  Has appt today @ 1630, has a brown discharge.  Pt does report + fm.  Pt advised ok to keep appt and monitor until appt today.  If bldg increases at any time to call office prior to appt and will get pt in sooner.  Pt voices agreement.

## 2012-04-18 NOTE — H&P (Signed)
Sherry Johnston is a 36 y.o. female presenting for uc since 7 pm, denies srom or vag bleeding, with +FM, plans epidural Maternal Medical History:  Reason for admission: Reason for admission: contractions.  Contractions: Onset was 3-5 hours ago.   Frequency: regular.   Perceived severity is moderate.    Fetal activity: Perceived fetal activity is normal.    Prenatal Complications - Diabetes: none.    Medications Prior to Admission  Medication Sig Dispense Refill  . acetaminophen (TYLENOL) 500 MG tablet Take 1,000 mg by mouth every 6 (six) hours as needed.      . magnesium 30 MG tablet Take 30 mg by mouth 2 (two) times daily.      . Melatonin 10 MG TABS Take by mouth.      Marland Kitchen PRENATAL VITAMINS PO Take 1 tablet by mouth.      . Pseudoephedrine HCl (SUDAFED 12 HOUR PO) Take by mouth.      . butalbital-acetaminophen-caffeine (FIORICET) 50-325-40 MG per tablet Take 1 tablet by mouth every 4 (four) hours as needed for headache.  36 tablet  1  . terconazole (TERAZOL 3) 0.8 % vaginal cream        OB History    Grav Para Term Preterm Abortions TAB SAB Ect Mult Living   5 1 1  0 3 0 0 3 0 1    hx of ectopic x 3: 720/08 12/08 7/12 7/08 F SVD 7#12  Current pg IVF dating per 6 5/7 week [place], pregnancy complicated by migraines to 14 weeks, growth at 70% at 33 weeks Past Medical History  Diagnosis Date  . Depression   . Hx of migraines   . Bladder infection   . Hypothyroidism     H/O  . Chronic headaches     since age 35  . Spotting in first trimester 11/22/06     @ 4-5 weeks  . Constipation 11/24/06  . Nausea 11/24/06  . Ectopic pregnancy 11/26/06  . H/O urinary frequency 09/13/07  . Acute mastitis of left breast 05/24/08  . Infertility, female 12/25/09  . H/O candidiasis   . History of shingles     17 yrs. old ?  Marland Kitchen History of bulimia   . Dengue fever     2002   Past Surgical History  Procedure Date  . Salpingectomy     right  . Mandible fracture surgery   . Ectopic pregnancy  surgery    Family History: family history includes Alcohol abuse in her father; COPD in her maternal grandmother; Cancer in her father, maternal grandmother, mother, and paternal grandfather; Heart disease in her paternal grandmother; Hypertension in her paternal grandmother; and Mental illness in her brother. Social History:  reports that she has never smoked. She has never used smokeless tobacco. She reports that she does not drink alcohol or use illicit drugs.   Prenatal Transfer Tool  Maternal Diabetes: No Genetic Screening: Normal Maternal Ultrasounds/Referrals: Normal anterior placenta Fetal Ultrasounds or other Referrals:  None Maternal Substance Abuse:  No Significant Maternal Medications:  None Significant Maternal Lab Results:  Lab values include: Group B Strep negative Other Comments:  None  ROS  Dilation: 4.5 Effacement (%): 90 Station: -1 Exam by:: Chiquita Loth CNM Blood pressure 133/92, pulse 98, temperature 98.8 F (37.1 C), temperature source Oral, resp. rate 16, height 5\' 4"  (1.626 m), weight 183 lb (83.008 kg), SpO2 98.00%. Exam Physical Exam  Calm, no distress, moves about in bed and deep breathing with uc, cooperative, HEENT WNL  grossly,  lungs clear bilaterally, AP RRR,  abd soft nt,no masses, not tympanic bowel sounds active,  EFW 8# vertex posterior cervix with bulging membranes no edema to lower extremities   Prenatal labs: ABO, Rh: O/POS/-- (05/10 1101) Antibody: NEG (05/10 1101) Rubella: 46.1 (05/10 1101) RPR: NON REAC (09/09 1058)  HBsAg: NEGATIVE (05/16 1629)  HIV: NON REACTIVE (05/16 1629)  GBS: Negative (11/07 0000)   Assessment/Plan: [redacted]w[redacted]d GBS neg Active labor Routine admission, epidural, collaboration with Dr. Estanislado Pandy per telephone.   Sherry Johnston 04/18/2012, 11:06 PM

## 2012-04-18 NOTE — MAU Note (Signed)
Pt reports contractions are worsening and closer together.

## 2012-04-18 NOTE — Progress Notes (Signed)
[redacted]w[redacted]d Ready for induction. Active baby. Membranes stripped. Dr. Stefano Gaul

## 2012-04-18 NOTE — Progress Notes (Signed)
[redacted]w[redacted]d  Pt c/o: watery discharge that was brown.  Pt desires cervix check today.

## 2012-04-19 ENCOUNTER — Encounter (HOSPITAL_COMMUNITY): Payer: Self-pay | Admitting: Anesthesiology

## 2012-04-19 ENCOUNTER — Inpatient Hospital Stay (HOSPITAL_COMMUNITY): Payer: BC Managed Care – PPO | Admitting: Anesthesiology

## 2012-04-19 ENCOUNTER — Encounter (HOSPITAL_COMMUNITY): Payer: Self-pay | Admitting: *Deleted

## 2012-04-19 LAB — CBC
HCT: 30.9 % — ABNORMAL LOW (ref 36.0–46.0)
Hemoglobin: 10 g/dL — ABNORMAL LOW (ref 12.0–15.0)
MCH: 26.3 pg (ref 26.0–34.0)
MCHC: 32.4 g/dL (ref 30.0–36.0)

## 2012-04-19 LAB — RPR: RPR Ser Ql: NONREACTIVE

## 2012-04-19 MED ORDER — ONDANSETRON HCL 4 MG PO TABS: 4.0000 mg | ORAL_TABLET | ORAL | Status: DC | PRN

## 2012-04-19 MED ORDER — BENZOCAINE-MENTHOL 20-0.5 % EX AERO
1.0000 | INHALATION_SPRAY | CUTANEOUS | Status: DC | PRN
Start: 2012-04-19 — End: 2012-04-20
  Filled 2012-04-19: qty 56

## 2012-04-19 MED ORDER — SIMETHICONE 80 MG PO CHEW: 80.0000 mg | CHEWABLE_TABLET | ORAL | Status: DC | PRN

## 2012-04-19 MED ORDER — DIBUCAINE 1 % RE OINT: 1.0000 "application " | TOPICAL_OINTMENT | RECTAL | Status: DC | PRN

## 2012-04-19 MED ORDER — ONDANSETRON HCL 4 MG/2ML IJ SOLN: 4.0000 mg | INTRAMUSCULAR | Status: DC | PRN

## 2012-04-19 MED ORDER — WITCH HAZEL-GLYCERIN EX PADS: 1.0000 "application " | MEDICATED_PAD | CUTANEOUS | Status: DC | PRN

## 2012-04-19 MED ORDER — ZOLPIDEM TARTRATE 5 MG PO TABS: 5.0000 mg | ORAL_TABLET | Freq: Every evening | ORAL | Status: DC | PRN

## 2012-04-19 MED ORDER — OXYCODONE-ACETAMINOPHEN 5-325 MG PO TABS
1.0000 | ORAL_TABLET | ORAL | Status: DC | PRN
  Administered 2012-04-19: 1 via ORAL
  Filled 2012-04-19: qty 1

## 2012-04-19 MED ORDER — IBUPROFEN 600 MG PO TABS
600.0000 mg | ORAL_TABLET | Freq: Four times a day (QID) | ORAL | Status: DC
  Administered 2012-04-19 – 2012-04-20 (×6): 600 mg via ORAL
  Filled 2012-04-19 (×6): qty 1

## 2012-04-19 MED ORDER — LIDOCAINE HCL (PF) 1 % IJ SOLN
INTRAMUSCULAR | Status: DC | PRN
  Administered 2012-04-19 (×4): 4 mL

## 2012-04-19 MED ORDER — PRENATAL MULTIVITAMIN CH
1.0000 | ORAL_TABLET | Freq: Every day | ORAL | Status: DC
  Administered 2012-04-19 – 2012-04-20 (×2): 1 via ORAL
  Filled 2012-04-19 (×2): qty 1

## 2012-04-19 MED ORDER — LANOLIN HYDROUS EX OINT: TOPICAL_OINTMENT | CUTANEOUS | Status: DC | PRN

## 2012-04-19 MED ORDER — TETANUS-DIPHTH-ACELL PERTUSSIS 5-2.5-18.5 LF-MCG/0.5 IM SUSP
0.5000 mL | Freq: Once | INTRAMUSCULAR | Status: AC
  Administered 2012-04-19: 0.5 mL via INTRAMUSCULAR
  Filled 2012-04-19: qty 0.5

## 2012-04-19 MED ORDER — DIPHENHYDRAMINE HCL 25 MG PO CAPS
25.0000 mg | ORAL_CAPSULE | Freq: Four times a day (QID) | ORAL | Status: DC | PRN
  Administered 2012-04-19: 25 mg via ORAL
  Filled 2012-04-19: qty 1

## 2012-04-19 MED ORDER — SENNOSIDES-DOCUSATE SODIUM 8.6-50 MG PO TABS
2.0000 | ORAL_TABLET | Freq: Every day | ORAL | Status: DC
  Administered 2012-04-19: 2 via ORAL

## 2012-04-19 NOTE — Progress Notes (Signed)
Comfortable with epidural, some pressure like I have to pee water just broke O BP 118/85  Pulse 141  Temp 97.6 F (36.4 C) (Oral)  Resp 20  Ht 5\' 4"  (1.626 m)  Wt 183 lb (83.008 kg)  BMI 31.41 kg/m2  SpO2 97%      fhts 150 early decels LTV mod      abd soft between uc        q 2-4 mod Contractions       Vag 5 100 0 VTX SROM clear sm amount Hemoglobin & Hematocrit     Component Value Date/Time   HGB 10.9* 04/18/2012 2336   HCT 33.3* 04/18/2012 2336   A [redacted]w[redacted]d     Active labor P continue care Lavera Guise, CNM

## 2012-04-19 NOTE — Progress Notes (Addendum)
Patient was referred for history of depression/anxiety. * Referral screened out by Clinical Social Worker because none of the following criteria appear to apply:  ~ History of anxiety/depression during this pregnancy, or of post-partum depression.  ~ Diagnosis of anxiety and/or depression within last 3 years, as per pt.  ~ History of depression due to pregnancy loss/loss of child  OR * Patient's symptoms currently being treated with medication and/or therapy.  Please contact the Clinical Social Worker if needs arise, or by the patient's request.  

## 2012-04-19 NOTE — Anesthesia Preprocedure Evaluation (Signed)
Anesthesia Evaluation  Patient identified by MRN, date of birth, ID band Patient awake    Reviewed: Allergy & Precautions, H&P , NPO status , Patient's Chart, lab work & pertinent test results, reviewed documented beta blocker date and time   History of Anesthesia Complications Negative for: history of anesthetic complications  Airway Mallampati: III TM Distance: >3 FB Neck ROM: full  Mouth opening: Limited Mouth Opening  Dental  (+) Teeth Intact   Pulmonary neg pulmonary ROS,  breath sounds clear to auscultation        Cardiovascular negative cardio ROS  Rhythm:regular Rate:Normal     Neuro/Psych  Headaches, negative psych ROS   GI/Hepatic negative GI ROS, Neg liver ROS,   Endo/Other  negative endocrine ROS  Renal/GU negative Renal ROS  negative genitourinary   Musculoskeletal   Abdominal   Peds  Hematology negative hematology ROS (+)   Anesthesia Other Findings   Reproductive/Obstetrics (+) Pregnancy                           Anesthesia Physical Anesthesia Plan  ASA: II  Anesthesia Plan: Epidural   Post-op Pain Management:    Induction:   Airway Management Planned:   Additional Equipment:   Intra-op Plan:   Post-operative Plan:   Informed Consent: I have reviewed the patients History and Physical, chart, labs and discussed the procedure including the risks, benefits and alternatives for the proposed anesthesia with the patient or authorized representative who has indicated his/her understanding and acceptance.     Plan Discussed with:   Anesthesia Plan Comments:         Anesthesia Quick Evaluation

## 2012-04-19 NOTE — Progress Notes (Signed)
Post Partum Day 1:S/P SVB, 2nd degree vaginal Subjective: Patient up ad lib, denies syncope or dizziness.  Had reaction to Hypafix tape at epidural site--resolved when dressing removed and after Benadryl.  Had small area of hive-type inflammation on right upper thigh, but that has completely resolved (did not have a foley cathether).  Will note Hypafix rxn in allergy section of chart. Feeding:  Breast Contraceptive plan:   Undecided at present  Objective: Blood pressure 127/82, pulse 100, temperature 99 F (37.2 C), temperature source Oral, resp. rate 20, height 5\' 4"  (1.626 m), weight 183 lb (83.008 kg), SpO2 97.00%, unknown if currently breastfeeding.  Physical Exam:  General: alert Lochia: appropriate Uterine Fundus: firm Incision: healing well DVT Evaluation: No evidence of DVT seen on physical exam. Negative Homan's sign.   Basename 04/19/12 0520 04/18/12 2336  HGB 10.0* 10.9*  HCT 30.9* 33.3*    Assessment/Plan: S/P Vaginal delivery day 0 Rxn to Hypafix  Continue current care    LOS: 1 day   Daryn Pisani 04/19/2012, 8:08 AM

## 2012-04-19 NOTE — Anesthesia Procedure Notes (Signed)
Epidural Patient location during procedure: OB Start time: 04/19/2012 12:17 AM  Staffing Performed by: anesthesiologist   Preanesthetic Checklist Completed: patient identified, site marked, surgical consent, pre-op evaluation, timeout performed, IV checked, risks and benefits discussed and monitors and equipment checked  Epidural Patient position: sitting Prep: site prepped and draped and DuraPrep Patient monitoring: continuous pulse ox and blood pressure Approach: midline Injection technique: LOR air  Needle:  Needle type: Tuohy  Needle gauge: 17 G Needle length: 9 cm and 9 Needle insertion depth: 5 cm cm Catheter type: closed end flexible Catheter size: 19 Gauge Catheter at skin depth: 10 cm Test dose: negative  Assessment Events: blood not aspirated, injection not painful, no injection resistance, negative IV test and no paresthesia  Additional Notes Discussed risk of headache, infection, bleeding, nerve injury and failed or incomplete block.  Patient voices understanding and wishes to proceed.  Epidural placed easily on first attempt.  Patient tolerated procedure well with no apparent complications.  Jasmine December, MDReason for block:procedure for pain

## 2012-04-19 NOTE — Anesthesia Postprocedure Evaluation (Signed)
  Anesthesia Post-op Note  Patient: Sherry Johnston  Procedure(s) Performed: * No procedures listed *  Patient Location: Mother/Baby  Anesthesia Type:Epidural  Level of Consciousness: awake  Airway and Oxygen Therapy: Patient Spontanous Breathing  Post-op Pain: none  Post-op Assessment: Patient's Cardiovascular Status Stable, Respiratory Function Stable, Patent Airway, No signs of Nausea or vomiting, Adequate PO intake, Pain level controlled, No headache, No backache, No residual numbness and No residual motor weakness  Post-op Vital Signs: Reviewed and stable  Complications: No apparent anesthesia complications

## 2012-04-20 ENCOUNTER — Inpatient Hospital Stay (HOSPITAL_COMMUNITY): Admission: RE | Admit: 2012-04-20 | Payer: BC Managed Care – PPO | Source: Ambulatory Visit

## 2012-04-20 MED ORDER — POLYSACCHARIDE IRON COMPLEX 150 MG PO CAPS
150.0000 mg | ORAL_CAPSULE | Freq: Every day | ORAL | Status: DC
  Filled 2012-04-20 (×2): qty 1

## 2012-04-20 MED ORDER — OXYCODONE-ACETAMINOPHEN 5-325 MG PO TABS: 1.0000 | ORAL_TABLET | ORAL | Status: DC | PRN

## 2012-04-24 NOTE — Discharge Summary (Signed)
  Vaginal Delivery Discharge Summary  Sherry Johnston  DOB:    [birth year] MRN:    [id number] CSN:    [id number]  Date of admission:                  04/18/2012  Date of discharge:                   04/20/2012  Procedures this admission:  Epidural , NSVD  Newborn Data:  Live born female  Birth Weight: 8 lb 3 oz (3714 g) APGAR: 9, 9  Home with mother.   History of Present Illness:  Sherry Johnston is a 36 y.o. female, [id number], who presents at [redacted]w[redacted]d weeks gestation. The patient has been followed at the [facility] division of [facility]. She was admitted onset of labor. Her pregnancy has been complicated by: none.  Hospital course:  The patient was admitted for labor.   Her labor was not complicated. She proceeded to have a vaginal delivery of a healthy infant. Her delivery was not complicated. Her postpartum course was not complicated.  She was discharged to home on postpartum day 1 doing well.  Feeding:  breast  Contraception:  declines, pt conceived w IVF   Discharge hemoglobin:  Hemoglobin  Date Value Range Status  04/19/2012 10.0* 12.0 - 15.0 g/dL Final     HCT  Date Value Range Status  04/19/2012 30.9* 36.0 - 46.0 % Final    Discharge Physical Exam:   General: no distress Lochia: appropriate Uterine Fundus: firm Incision: healing well DVT Evaluation: No evidence of DVT seen on physical exam.  Intrapartum Procedures: spontaneous vaginal delivery Postpartum Procedures: none Complications-Operative and Postpartum: none  Discharge Diagnoses: Term Pregnancy-delivered  Discharge Information:  Activity:           pelvic rest Diet:                routine Medications: PNV and Ibuprofen Condition:      stable Instructions:  refer to practice specific booklet Discharge to: home  Follow-up Information    Follow up with [facility]. In 6 weeks.   Contact information:    [address] [place] [address] [phone]          Sherry Johnston 04/24/2012

## 2012-06-02 ENCOUNTER — Ambulatory Visit: Payer: BC Managed Care – HMO | Admitting: Obstetrics and Gynecology

## 2012-06-02 ENCOUNTER — Encounter: Payer: Self-pay | Admitting: Obstetrics and Gynecology

## 2012-06-02 NOTE — Progress Notes (Signed)
[facility] POST PARTUM OFFICE VISIT  Today's Date: 06/02/2012  Ms. Sherry Johnston is a 37 y.o. year old female,[id number], who presents for a postpartum visit. She had a vaginal delivery on April 18, 2012 of a healthy female infant named Melvern Banker.   Subjective:  Doing well.  Normal GI and GU function.  Objective:  BP 104/60  Ht 5\' 4"  (1.626 m)  Wt 162 lb (73.483 kg)  BMI 27.81 kg/m2  Breastfeeding? Yes   General: no distress GI: soft and nontender  External genitalia: normal general appearance Vaginal: normal without tenderness, induration or masses Cervix: nontender Adnexa: normal bimanual exam Uterus: normal size  Assessment:  Doing well status post vaginal delivery  Plan:    1. Contraception: coitus interruptus. 2. EPDS: 2. 3. Follow up in: 4 months or as needed. 4. Breast Feeding: Yes. 5. Return to work, exercising, and a normal activities: yes.  Leonard Schwartz M.D.  06/02/2012 9:27 AM

## 2012-06-02 NOTE — Progress Notes (Signed)
Date of delivery: 04/19/2012 Female Name: Sherry Johnston "Zenaida Niece" Vaginal delivery:yes Cesarean section:no Tubal ligation:no GDM:no Breast Feeding:yes Bottle Feeding:yes Post-Partum Blues:no Abnormal pap:no Normal GU function: yes Normal GI function:yes Returning to work:yes @home   EPDS: 2

## 2013-11-21 DIAGNOSIS — G43709 Chronic migraine without aura, not intractable, without status migrainosus: Secondary | ICD-10-CM | POA: Insufficient documentation

## 2014-03-05 ENCOUNTER — Encounter: Payer: Self-pay | Admitting: Obstetrics and Gynecology

## 2014-03-28 ENCOUNTER — Other Ambulatory Visit: Payer: Self-pay | Admitting: Obstetrics and Gynecology

## 2014-03-28 DIAGNOSIS — Z1231 Encounter for screening mammogram for malignant neoplasm of breast: Secondary | ICD-10-CM

## 2014-05-09 ENCOUNTER — Ambulatory Visit
Admission: RE | Admit: 2014-05-09 | Discharge: 2014-05-09 | Disposition: A | Discharge status: Home / Self Care | Payer: BLUE CROSS/BLUE SHIELD | Source: Ambulatory Visit | Attending: Obstetrics and Gynecology | Admitting: Obstetrics and Gynecology

## 2014-05-09 DIAGNOSIS — Z1231 Encounter for screening mammogram for malignant neoplasm of breast: Secondary | ICD-10-CM

## 2014-10-23 ENCOUNTER — Ambulatory Visit (INDEPENDENT_AMBULATORY_CARE_PROVIDER_SITE_OTHER): Payer: BLUE CROSS/BLUE SHIELD | Admitting: Emergency Medicine

## 2014-10-23 VITALS — BP 110/68 | HR 87 | Temp 98.3°F | Resp 16 | Ht 64.0 in | Wt 170.0 lb

## 2014-10-23 DIAGNOSIS — H6093 Unspecified otitis externa, bilateral: Secondary | ICD-10-CM

## 2014-10-23 MED ORDER — NEOMYCIN-POLYMYXIN-HC OP SUSP: 4.0000 [drp] | Freq: Four times a day (QID) | OPHTHALMIC | Status: DC

## 2014-10-23 NOTE — Progress Notes (Signed)
Subjective:  Patient ID: Sherry Johnston, female    DOB: 1976-02-24  Age: 39 y.o. MRN: [id number]  CC: Ear Pain   HPI Sherry Johnston presents  pain and drainage from her right ear. She said the drainage resolved most of the pain. She has been instrumenting her ear. She has no fever chills nasal congestion postnasal drainage or other acute complaint. She has not been swimming. She has no problem with over-the-counter medication.  History Boni has a past medical history of Depression; migraines; Bladder infection; Hypothyroidism; Chronic headaches; Spotting in first trimester (11/22/06); Constipation (11/24/06); Nausea (11/24/06); Ectopic pregnancy (11/26/06); H/O urinary frequency (09/13/07); Acute mastitis of left breast (05/24/08); Infertility, female (12/25/09); H/O candidiasis; History of shingles; History of bulimia; Dengue fever; Allergy; and Anxiety.   She has past surgical history that includes Salpingectomy; Mandible fracture surgery; and Ectopic pregnancy surgery.   Her  family history includes Alcohol abuse in her father; COPD in her maternal grandmother; Cancer in her father, maternal grandmother, mother, and paternal grandfather; Heart disease in her paternal grandmother; Hyperlipidemia in her father and paternal grandmother; Hypertension in her paternal grandmother; Mental illness in her brother and maternal grandfather.  She   reports that she has never smoked. She has never used smokeless tobacco. She reports that she does not drink alcohol or use illicit drugs.  Outpatient Prescriptions Prior to Visit  Medication Sig Dispense Refill  . acetaminophen (TYLENOL) 500 MG tablet Take 1,000 mg by mouth every 6 (six) hours as needed.    Marland Kitchen ibuprofen (ADVIL,MOTRIN) 100 MG chewable tablet Chew 100 mg by mouth every 8 (eight) hours as needed.    . magnesium 30 MG tablet Take 30 mg by mouth 2 (two) times daily.    . Melatonin 10 MG TABS Take by mouth.    . oxyCODONE-acetaminophen  (PERCOCET/ROXICET) 5-325 MG per tablet Take 1-2 tablets by mouth every 4 (four) hours as needed (moderate - severe pain). 30 tablet 0  . Prenatal Vit-Fe Fumarate-FA (PRENATAL MULTIVITAMIN) TABS Take 1 tablet by mouth daily.    . Pseudoephedrine HCl (SUDAFED 12 HOUR PO) Take by mouth.     No facility-administered medications prior to visit.    History   Social History  . Marital Status: Married    Spouse Name: N/A  . Number of Children: N/A  . Years of Education: N/A   Social History Main Topics  . Smoking status: Never Smoker   . Smokeless tobacco: Never Used  . Alcohol Use: No  . Drug Use: No  . Sexual Activity: Yes    Birth Control/ Protection: None   Other Topics Concern  . None   Social History Narrative     Review of Systems  Constitutional: Negative for fever, chills and appetite change.  HENT: Positive for ear discharge and ear pain. Negative for congestion, postnasal drip, sinus pressure and sore throat.   Eyes: Negative for pain and redness.  Respiratory: Negative for cough, shortness of breath and wheezing.   Cardiovascular: Negative for leg swelling.  Gastrointestinal: Negative for nausea, vomiting, abdominal pain, diarrhea, constipation and blood in stool.  Endocrine: Negative for polyuria.  Genitourinary: Negative for dysuria, urgency, frequency and flank pain.  Musculoskeletal: Negative for gait problem.  Skin: Negative for rash.  Neurological: Negative for weakness and headaches.  Psychiatric/Behavioral: Negative for confusion and decreased concentration. The patient is not nervous/anxious.     Objective:  BP 110/68 mmHg  Pulse 87  Temp(Src) 98.3 F (36.8 C) (Oral)  Resp 16  Ht  (1.626 m)  Wt 170 lb (77.111 kg)  BMI 29.17 kg/m2  SpO2 98%  LMP 10/09/2014  Physical Exam  Constitutional: She is oriented to person, place, and time. She appears well-developed and well-nourished. No distress.  HENT:  Head: Normocephalic and atraumatic.  Right  Ear: There is tenderness. No drainage. No middle ear effusion.  Left Ear: There is tenderness.  No middle ear effusion.  Nose: Nose normal.  Eyes: Conjunctivae and EOM are normal. Pupils are equal, round, and reactive to light. No scleral icterus.  Neck: Normal range of motion. Neck supple. No tracheal deviation present.  Cardiovascular: Normal rate, regular rhythm and normal heart sounds.   Pulmonary/Chest: Effort normal. No respiratory distress. She has no wheezes. She has no rales.  Abdominal: She exhibits no mass. There is no tenderness. There is no rebound and no guarding.  Musculoskeletal: She exhibits no edema.  Lymphadenopathy:    She has no cervical adenopathy.  Neurological: She is alert and oriented to person, place, and time. Coordination normal.  Skin: Skin is warm and dry. No rash noted.  Psychiatric: She has a normal mood and affect. Her behavior is normal.      Assessment & Plan:   Verdie was seen today for ear pain.  Diagnoses and all orders for this visit:  Otitis externa, bilateral  Other orders -     NEOMYCIN-POLYMYXIN-HC, OPHTH, SUSP; Apply 4 drops to eye 4 (four) times daily.  I have discontinued Ms. Teschner's Pseudoephedrine HCl (SUDAFED 12 HOUR PO), magnesium, Melatonin, prenatal multivitamin, oxyCODONE-acetaminophen, and ibuprofen. I am also having her start on NEOMYCIN-POLYMYXIN-HC [facility]). Additionally, I am having her maintain her acetaminophen, eletriptan, and botulinum toxin Type A.  Meds ordered this encounter  Medications  . eletriptan (RELPAX) 40 MG tablet    Sig: Take 40 mg by mouth as needed for migraine or headache. May repeat in 2 hours if headache persists or recurs.  . botulinum toxin Type A (BOTOX) 100 UNITS SOLR injection    Sig: Inject 100 Units into the muscle once.  . NEOMYCIN-POLYMYXIN-HC, OPHTH, SUSP    Sig: Apply 4 drops to eye 4 (four) times daily.    Dispense:  1 Bottle    Refill:  0    Appropriate red flag conditions were  discussed with the patient as well as actions that should be taken.  Patient expressed his understanding.  Follow-up: Return if symptoms worsen or fail to improve.  Carmelina Dane, MD

## 2014-10-23 NOTE — Patient Instructions (Signed)

## 2014-10-25 ENCOUNTER — Telehealth: Payer: Self-pay

## 2014-10-25 NOTE — Telephone Encounter (Signed)
Ask the pharmacy to give her cortisporin suspension

## 2014-10-25 NOTE — Telephone Encounter (Signed)
Pharm faxed note advising that the neomycin-polymyxin-HC ophthalmic sol is a special order and want to know if you would authorize change to the ophthalmic sol w/dexamethasine instead? Dr Dareen Piano, I noticed that the sig on the original Rx stated to place drops into eye, but Dx is for ear. Please review sig when/if you send in a different Rx to make sure it reads the way you want pt to administer. Thanks!

## 2014-10-29 NOTE — Telephone Encounter (Signed)
Called pharm and they stated that pt has already gotten cortisporin Rx and nothing else needed.

## 2014-11-03 ENCOUNTER — Ambulatory Visit (INDEPENDENT_AMBULATORY_CARE_PROVIDER_SITE_OTHER): Payer: BLUE CROSS/BLUE SHIELD | Admitting: Emergency Medicine

## 2014-11-03 VITALS — BP 100/60 | HR 98 | Temp 97.7°F | Resp 18 | Ht 63.75 in | Wt 171.1 lb

## 2014-11-03 DIAGNOSIS — H6983 Other specified disorders of Eustachian tube, bilateral: Secondary | ICD-10-CM | POA: Diagnosis not present

## 2014-11-03 DIAGNOSIS — J302 Other seasonal allergic rhinitis: Secondary | ICD-10-CM

## 2014-11-03 MED ORDER — TRIAMCINOLONE ACETONIDE 55 MCG/ACT NA AERO: 2.0000 | INHALATION_SPRAY | Freq: Every day | NASAL | Status: DC

## 2014-11-03 MED ORDER — PSEUDOEPHEDRINE-GUAIFENESIN ER 60-600 MG PO TB12: 1.0000 | ORAL_TABLET | Freq: Two times a day (BID) | ORAL | Status: DC

## 2014-11-03 NOTE — Progress Notes (Signed)
Subjective:  Patient ID: Sherry Johnston, female    DOB: April 09, 1976  Age: 39 y.o. MRN: [id number]  CC: Otitis Media   HPI Sherry Johnston presents  with pressure in her ears. She was treated recently with antibiotic drops for swimmer's ear and cerumen impaction and she now has been using multiple different liquids for irrigation [name] she has recurrent cerumen impaction her hearing acuity is diminished. She has no fever chills cough or other complaints she does have a history of seasonal allergic rhinitis with acute nasal congestion and drainage.  History Sherry Johnston has a past medical history of Depression; migraines; Bladder infection; Hypothyroidism; Chronic headaches; Spotting in first trimester (11/22/06); Constipation (11/24/06); Nausea (11/24/06); Ectopic pregnancy (11/26/06); H/O urinary frequency (09/13/07); Acute mastitis of left breast (05/24/08); Infertility, female (12/25/09); H/O candidiasis; History of shingles; History of bulimia; Dengue fever; Allergy; and Anxiety.   She has past surgical history that includes Salpingectomy; Mandible fracture surgery; and Ectopic pregnancy surgery.   Her  family history includes Alcohol abuse in her father; COPD in her maternal grandmother; Cancer in her father, maternal grandmother, mother, and paternal grandfather; Heart disease in her paternal grandmother; Hyperlipidemia in her father and paternal grandmother; Hypertension in her paternal grandmother; Mental illness in her brother and maternal grandfather.  She   reports that she has never smoked. She has never used smokeless tobacco. She reports that she does not drink alcohol or use illicit drugs.  Outpatient Prescriptions Prior to Visit  Medication Sig Dispense Refill  . acetaminophen (TYLENOL) 500 MG tablet Take 1,000 mg by mouth every 6 (six) hours as needed.    . botulinum toxin Type A (BOTOX) 100 UNITS SOLR injection Inject 100 Units into the muscle once.    . eletriptan (RELPAX)  40 MG tablet Take 40 mg by mouth as needed for migraine or headache. May repeat in 2 hours if headache persists or recurs.    . NEOMYCIN-POLYMYXIN-HC, OPHTH, SUSP Apply 4 drops to eye 4 (four) times daily. 1 Bottle 0   No facility-administered medications prior to visit.    History   Social History  . Marital Status: Married    Spouse Name: N/A  . Number of Children: N/A  . Years of Education: N/A   Social History Main Topics  . Smoking status: Never Smoker   . Smokeless tobacco: Never Used  . Alcohol Use: No  . Drug Use: No  . Sexual Activity: Yes    Birth Control/ Protection: None   Other Topics Concern  . None   Social History Narrative     Review of Systems  Constitutional: Negative for fever, chills and appetite change.  HENT: Positive for ear pain and hearing loss. Negative for congestion, postnasal drip, sinus pressure and sore throat.   Eyes: Negative for pain and redness.  Respiratory: Negative for cough, shortness of breath and wheezing.   Cardiovascular: Negative for leg swelling.  Gastrointestinal: Negative for nausea, vomiting, abdominal pain, diarrhea, constipation and blood in stool.  Endocrine: Negative for polyuria.  Genitourinary: Negative for dysuria, urgency, frequency and flank pain.  Musculoskeletal: Negative for gait problem.  Skin: Negative for rash.  Neurological: Negative for weakness and headaches.  Psychiatric/Behavioral: Negative for confusion and decreased concentration. The patient is not nervous/anxious.     Objective:  BP 100/60 mmHg  Pulse 98  Temp(Src) 97.7 F (36.5 C) (Oral)  Resp 18  Ht 5' 3.75" (1.619 m)  Wt 171 lb 2 oz (77.622 kg)  BMI 29.61 kg/m2  SpO2 97%  LMP 10/09/2014  Physical Exam  Constitutional: She is oriented to person, place, and time. She appears well-developed and well-nourished.  HENT:  Head: Normocephalic and atraumatic.  Eyes: Conjunctivae are normal. Pupils are equal, round, and reactive to light.    Pulmonary/Chest: Effort normal.  Musculoskeletal: She exhibits no edema.  Neurological: She is alert and oriented to person, place, and time.  Skin: Skin is dry.  Psychiatric: She has a normal mood and affect. Her behavior is normal. Thought content normal.      Assessment & Plan:   Apolinar JunesDorothea was seen today for otitis media.  Diagnoses and all orders for this visit:  Seasonal allergic rhinitis  Eustachian tube dysfunction, bilateral  Other orders -     pseudoephedrine-guaifenesin (MUCINEX D) 60-600 MG per tablet; Take 1 tablet by mouth every 12 (twelve) hours. -     triamcinolone (NASACORT AQ) 55 MCG/ACT AERO nasal inhaler; Place 2 sprays into the nose daily.   I am having Ms. Haan start on pseudoephedrine-guaifenesin and triamcinolone. I am also having her maintain her acetaminophen, eletriptan, botulinum toxin Type A, NEOMYCIN-POLYMYXIN-HC (OPHTH), and verapamil.  Meds ordered this encounter  Medications  . verapamil (CALAN) 40 MG tablet    Sig: Take 40 mg by mouth at bedtime.  . pseudoephedrine-guaifenesin (MUCINEX D) 60-600 MG per tablet    Sig: Take 1 tablet by mouth every 12 (twelve) hours.    Dispense:  18 tablet    Refill:  0  . triamcinolone (NASACORT AQ) 55 MCG/ACT AERO nasal inhaler    Sig: Place 2 sprays into the nose daily.    Dispense:  1 Inhaler    Refill:  12    Appropriate red flag conditions were discussed with the patient as well as actions that should be taken.  Patient expressed his understanding.  Follow-up: Return if symptoms worsen or fail to improve.  Carmelina DaneAnderson, Jeffery S, MD

## 2014-11-03 NOTE — Patient Instructions (Signed)
Barotitis Media Barotitis media is inflammation of your middle ear. This occurs when the auditory tube (eustachian tube) leading from the back of your nose (nasopharynx) to your eardrum is blocked. This blockage may result from a cold, environmental allergies, or an upper respiratory infection. Unresolved barotitis media may lead to damage or hearing loss (barotrauma), which may become permanent. HOME CARE INSTRUCTIONS   Use medicines as recommended by your health care provider. Over-the-counter medicines will help unblock the canal and can help during times of air travel.  Do not put anything into your ears to clean or unplug them. Eardrops will not be helpful.  Do not swim, dive, or fly until your health care provider says it is all right to do so. If these activities are necessary, chewing gum with frequent, forceful swallowing may help. It is also helpful to hold your nose and gently blow to pop your ears for equalizing pressure changes. This forces air into the eustachian tube.  Only take over-the-counter or prescription medicines for pain, discomfort, or fever as directed by your health care provider.  A decongestant may be helpful in decongesting the middle ear and make pressure equalization easier. SEEK MEDICAL CARE IF:  You experience a serious form of dizziness in which you feel as if the room is spinning and you feel nauseated (vertigo).  Your symptoms only involve one ear. SEEK IMMEDIATE MEDICAL CARE IF:   You develop a severe headache, dizziness, or severe ear pain.  You have bloody or pus-like drainage from your ears.  You develop a fever.  Your problems do not improve or become worse. MAKE SURE YOU:   Understand these instructions.  Will watch your condition.  Will get help right away if you are not doing well or get worse. Document Released: 04/17/2000 Document Revised: 02/08/2013 Document Reviewed: 11/15/2012 ExitCare Patient Information 2015 [employer]. This  information is not intended to replace advice given to you by your health care provider. Make sure you discuss any questions you have with your health care provider.  

## 2015-05-03 ENCOUNTER — Ambulatory Visit (INDEPENDENT_AMBULATORY_CARE_PROVIDER_SITE_OTHER): Payer: BLUE CROSS/BLUE SHIELD | Admitting: Family Medicine

## 2015-05-03 VITALS — BP 110/88 | HR 84 | Temp 97.7°F | Resp 16 | Ht 64.0 in | Wt 181.0 lb

## 2015-05-03 DIAGNOSIS — G43511 Persistent migraine aura without cerebral infarction, intractable, with status migrainosus: Secondary | ICD-10-CM | POA: Diagnosis not present

## 2015-05-03 MED ORDER — KETOROLAC TROMETHAMINE 60 MG/2ML IM SOLN
60.0000 mg | Freq: Once | INTRAMUSCULAR | Status: AC
  Administered 2015-05-03: 60 mg via INTRAMUSCULAR

## 2015-05-03 NOTE — Patient Instructions (Signed)
You will receive the Toradol injection 60 mg today.  If you have problems feel free to return, but continue your other regimen as per the headache specialist recommendations.

## 2015-05-03 NOTE — Progress Notes (Signed)
Patient ID: Sherry Johnston, female    DOB: 02-27-1976  Age: 39 y.o. MRN: [id number]  Chief Complaint  Patient presents with  . 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 She [place] headache pain [place] in [place]. She has received Botox injections about a week ago. She called her doctor yesterday and received a taper of prednisone. She started that yesterday, as well as taking Phenergan. She felt worse with a terrible headache last night. The pain is behind her right eye and down the right side of the neck. This morning she took a Relpax but still is having pain problems. Historically she has occasionally had to get a shot of Toradol. Although this is a bad one, it is typically a migraine for her.  Current allergies, medications, problem list, past/family and social histories reviewed.  Objective:  BP 110/88 mmHg  Pulse 84  Temp(Src) 97.7 F (36.5 C) (Oral)  Resp 16  Ht 5\' 4"  (1.626 m)  Wt 181 lb (82.101 kg)  BMI 31.05 kg/m2  SpO2 96%  LMP 04/25/2015  No carotid bruits. Chest clear. Heart regular. Alert and oriented.  Assessment & Plan:   Assessment: 1. Intractable persistent migraine aura without cerebral infarction and with status migrainosus       Plan: Will treat the migraine with Toradol. She is to return or see her primary migraine doctor if not improving.   Meds ordered this encounter  Medications  . ketorolac (TORADOL) injection 60 mg    Sig:          Patient Instructions  You will receive the Toradol injection 60 mg today.  If you have problems feel free to return, but continue your other regimen as per the headache specialist recommendations.     Return if symptoms worsen or fail to improve.   HOPPER,DAVID, MD 05/03/2015

## 2015-10-03 DIAGNOSIS — G8929 Other chronic pain: Secondary | ICD-10-CM | POA: Diagnosis not present

## 2015-10-03 DIAGNOSIS — R51 Headache: Secondary | ICD-10-CM | POA: Diagnosis not present

## 2015-10-08 DIAGNOSIS — Z803 Family history of malignant neoplasm of breast: Secondary | ICD-10-CM | POA: Insufficient documentation

## 2015-10-10 DIAGNOSIS — Z6829 Body mass index (BMI) 29.0-29.9, adult: Secondary | ICD-10-CM | POA: Diagnosis not present

## 2015-10-10 DIAGNOSIS — Z01419 Encounter for gynecological examination (general) (routine) without abnormal findings: Secondary | ICD-10-CM | POA: Diagnosis not present

## 2015-10-10 DIAGNOSIS — Z3040 Encounter for surveillance of contraceptives, unspecified: Secondary | ICD-10-CM | POA: Diagnosis not present

## 2015-10-13 DIAGNOSIS — G43719 Chronic migraine without aura, intractable, without status migrainosus: Secondary | ICD-10-CM | POA: Diagnosis not present

## 2015-10-14 DIAGNOSIS — G43709 Chronic migraine without aura, not intractable, without status migrainosus: Secondary | ICD-10-CM | POA: Diagnosis not present

## 2015-10-28 DIAGNOSIS — G43709 Chronic migraine without aura, not intractable, without status migrainosus: Secondary | ICD-10-CM | POA: Diagnosis not present

## 2015-11-29 DIAGNOSIS — M25561 Pain in right knee: Secondary | ICD-10-CM | POA: Diagnosis not present

## 2015-11-29 DIAGNOSIS — M7050 Other bursitis of knee, unspecified knee: Secondary | ICD-10-CM | POA: Diagnosis not present

## 2015-11-29 DIAGNOSIS — M2241 Chondromalacia patellae, right knee: Secondary | ICD-10-CM | POA: Diagnosis not present

## 2016-01-13 DIAGNOSIS — G43709 Chronic migraine without aura, not intractable, without status migrainosus: Secondary | ICD-10-CM | POA: Diagnosis not present

## 2016-02-04 DIAGNOSIS — G43709 Chronic migraine without aura, not intractable, without status migrainosus: Secondary | ICD-10-CM | POA: Diagnosis not present

## 2016-03-05 DIAGNOSIS — H6691 Otitis media, unspecified, right ear: Secondary | ICD-10-CM | POA: Diagnosis not present

## 2016-03-10 DIAGNOSIS — H6692 Otitis media, unspecified, left ear: Secondary | ICD-10-CM | POA: Diagnosis not present

## 2016-04-08 DIAGNOSIS — Z3183 Encounter for assisted reproductive fertility procedure cycle: Secondary | ICD-10-CM | POA: Diagnosis not present

## 2016-04-22 DIAGNOSIS — Z3183 Encounter for assisted reproductive fertility procedure cycle: Secondary | ICD-10-CM | POA: Diagnosis not present

## 2016-05-26 DIAGNOSIS — G43709 Chronic migraine without aura, not intractable, without status migrainosus: Secondary | ICD-10-CM | POA: Diagnosis not present

## 2016-06-01 DIAGNOSIS — G43709 Chronic migraine without aura, not intractable, without status migrainosus: Secondary | ICD-10-CM | POA: Diagnosis not present

## 2016-08-25 DIAGNOSIS — G43709 Chronic migraine without aura, not intractable, without status migrainosus: Secondary | ICD-10-CM | POA: Diagnosis not present

## 2016-09-09 DIAGNOSIS — G43709 Chronic migraine without aura, not intractable, without status migrainosus: Secondary | ICD-10-CM | POA: Diagnosis not present

## 2016-10-12 DIAGNOSIS — Z01419 Encounter for gynecological examination (general) (routine) without abnormal findings: Secondary | ICD-10-CM | POA: Diagnosis not present

## 2016-10-12 DIAGNOSIS — Z124 Encounter for screening for malignant neoplasm of cervix: Secondary | ICD-10-CM | POA: Diagnosis not present

## 2016-10-12 DIAGNOSIS — Z6830 Body mass index (BMI) 30.0-30.9, adult: Secondary | ICD-10-CM | POA: Diagnosis not present

## 2016-10-12 DIAGNOSIS — Z3040 Encounter for surveillance of contraceptives, unspecified: Secondary | ICD-10-CM | POA: Diagnosis not present

## 2016-11-03 DIAGNOSIS — H04302 Unspecified dacryocystitis of left lacrimal passage: Secondary | ICD-10-CM | POA: Diagnosis not present

## 2016-12-02 DIAGNOSIS — G43709 Chronic migraine without aura, not intractable, without status migrainosus: Secondary | ICD-10-CM | POA: Diagnosis not present

## 2016-12-15 DIAGNOSIS — L738 Other specified follicular disorders: Secondary | ICD-10-CM | POA: Diagnosis not present

## 2016-12-22 DIAGNOSIS — G43709 Chronic migraine without aura, not intractable, without status migrainosus: Secondary | ICD-10-CM | POA: Diagnosis not present

## 2017-03-03 DIAGNOSIS — Z23 Encounter for immunization: Secondary | ICD-10-CM | POA: Diagnosis not present

## 2017-03-03 DIAGNOSIS — G43009 Migraine without aura, not intractable, without status migrainosus: Secondary | ICD-10-CM | POA: Diagnosis not present

## 2017-03-16 DIAGNOSIS — G43709 Chronic migraine without aura, not intractable, without status migrainosus: Secondary | ICD-10-CM | POA: Diagnosis not present

## 2017-03-29 DIAGNOSIS — G43709 Chronic migraine without aura, not intractable, without status migrainosus: Secondary | ICD-10-CM | POA: Diagnosis not present

## 2017-04-01 DIAGNOSIS — Z3189 Encounter for other procreative management: Secondary | ICD-10-CM | POA: Diagnosis not present

## 2017-06-25 DIAGNOSIS — G43709 Chronic migraine without aura, not intractable, without status migrainosus: Secondary | ICD-10-CM | POA: Diagnosis not present

## 2017-06-28 DIAGNOSIS — G43009 Migraine without aura, not intractable, without status migrainosus: Secondary | ICD-10-CM | POA: Diagnosis not present

## 2017-06-28 DIAGNOSIS — Z0000 Encounter for general adult medical examination without abnormal findings: Secondary | ICD-10-CM | POA: Diagnosis not present

## 2017-06-28 DIAGNOSIS — M7989 Other specified soft tissue disorders: Secondary | ICD-10-CM | POA: Diagnosis not present

## 2017-06-28 DIAGNOSIS — Z13220 Encounter for screening for lipoid disorders: Secondary | ICD-10-CM | POA: Diagnosis not present

## 2017-06-28 DIAGNOSIS — E039 Hypothyroidism, unspecified: Secondary | ICD-10-CM | POA: Diagnosis not present

## 2017-07-01 ENCOUNTER — Other Ambulatory Visit: Payer: Self-pay | Admitting: Family Medicine

## 2017-07-01 DIAGNOSIS — M7989 Other specified soft tissue disorders: Secondary | ICD-10-CM

## 2017-07-01 DIAGNOSIS — G43709 Chronic migraine without aura, not intractable, without status migrainosus: Secondary | ICD-10-CM | POA: Diagnosis not present

## 2017-09-03 DIAGNOSIS — G43709 Chronic migraine without aura, not intractable, without status migrainosus: Secondary | ICD-10-CM | POA: Diagnosis not present

## 2017-09-07 DIAGNOSIS — B0059 Other herpesviral disease of eye: Secondary | ICD-10-CM | POA: Diagnosis not present

## 2017-10-04 DIAGNOSIS — B0059 Other herpesviral disease of eye: Secondary | ICD-10-CM | POA: Diagnosis not present

## 2017-10-04 DIAGNOSIS — H04123 Dry eye syndrome of bilateral lacrimal glands: Secondary | ICD-10-CM | POA: Diagnosis not present

## 2017-10-07 DIAGNOSIS — G43709 Chronic migraine without aura, not intractable, without status migrainosus: Secondary | ICD-10-CM | POA: Diagnosis not present

## 2017-10-28 DIAGNOSIS — G43111 Migraine with aura, intractable, with status migrainosus: Secondary | ICD-10-CM | POA: Diagnosis not present

## 2017-12-01 DIAGNOSIS — Z3040 Encounter for surveillance of contraceptives, unspecified: Secondary | ICD-10-CM | POA: Diagnosis not present

## 2017-12-01 DIAGNOSIS — Z01419 Encounter for gynecological examination (general) (routine) without abnormal findings: Secondary | ICD-10-CM | POA: Diagnosis not present

## 2017-12-01 DIAGNOSIS — Z1231 Encounter for screening mammogram for malignant neoplasm of breast: Secondary | ICD-10-CM | POA: Diagnosis not present

## 2017-12-01 DIAGNOSIS — Z6831 Body mass index (BMI) 31.0-31.9, adult: Secondary | ICD-10-CM | POA: Diagnosis not present

## 2017-12-08 DIAGNOSIS — G43709 Chronic migraine without aura, not intractable, without status migrainosus: Secondary | ICD-10-CM | POA: Diagnosis not present

## 2017-12-09 DIAGNOSIS — H00021 Hordeolum internum right upper eyelid: Secondary | ICD-10-CM | POA: Diagnosis not present

## 2018-01-10 DIAGNOSIS — G43709 Chronic migraine without aura, not intractable, without status migrainosus: Secondary | ICD-10-CM | POA: Diagnosis not present

## 2018-01-11 DIAGNOSIS — M2550 Pain in unspecified joint: Secondary | ICD-10-CM | POA: Diagnosis not present

## 2018-02-07 DIAGNOSIS — M2480 Other specific joint derangements of unspecified joint, not elsewhere classified: Secondary | ICD-10-CM | POA: Diagnosis not present

## 2018-02-07 DIAGNOSIS — M2550 Pain in unspecified joint: Secondary | ICD-10-CM | POA: Diagnosis not present

## 2018-04-06 DIAGNOSIS — G43709 Chronic migraine without aura, not intractable, without status migrainosus: Secondary | ICD-10-CM | POA: Diagnosis not present

## 2018-04-11 DIAGNOSIS — G43709 Chronic migraine without aura, not intractable, without status migrainosus: Secondary | ICD-10-CM | POA: Diagnosis not present

## 2018-05-10 DIAGNOSIS — R51 Headache: Secondary | ICD-10-CM | POA: Diagnosis not present

## 2018-05-23 DIAGNOSIS — Z3189 Encounter for other procreative management: Secondary | ICD-10-CM | POA: Diagnosis not present

## 2018-05-28 DIAGNOSIS — J029 Acute pharyngitis, unspecified: Secondary | ICD-10-CM | POA: Diagnosis not present

## 2018-06-14 DIAGNOSIS — G43709 Chronic migraine without aura, not intractable, without status migrainosus: Secondary | ICD-10-CM | POA: Diagnosis not present

## 2018-07-11 DIAGNOSIS — G43709 Chronic migraine without aura, not intractable, without status migrainosus: Secondary | ICD-10-CM | POA: Diagnosis not present

## 2018-09-06 DIAGNOSIS — G43709 Chronic migraine without aura, not intractable, without status migrainosus: Secondary | ICD-10-CM | POA: Diagnosis not present

## 2018-10-24 DIAGNOSIS — G43709 Chronic migraine without aura, not intractable, without status migrainosus: Secondary | ICD-10-CM | POA: Diagnosis not present

## 2019-01-13 DIAGNOSIS — G43709 Chronic migraine without aura, not intractable, without status migrainosus: Secondary | ICD-10-CM | POA: Diagnosis not present

## 2019-01-24 DIAGNOSIS — G43709 Chronic migraine without aura, not intractable, without status migrainosus: Secondary | ICD-10-CM | POA: Diagnosis not present

## 2019-03-21 DIAGNOSIS — G43709 Chronic migraine without aura, not intractable, without status migrainosus: Secondary | ICD-10-CM | POA: Diagnosis not present

## 2019-04-25 DIAGNOSIS — G43709 Chronic migraine without aura, not intractable, without status migrainosus: Secondary | ICD-10-CM | POA: Diagnosis not present

## 2019-06-29 DIAGNOSIS — G43709 Chronic migraine without aura, not intractable, without status migrainosus: Secondary | ICD-10-CM | POA: Diagnosis not present

## 2019-07-27 DIAGNOSIS — G43709 Chronic migraine without aura, not intractable, without status migrainosus: Secondary | ICD-10-CM | POA: Diagnosis not present

## 2019-10-09 DIAGNOSIS — L299 Pruritus, unspecified: Secondary | ICD-10-CM | POA: Diagnosis not present

## 2019-10-09 DIAGNOSIS — L508 Other urticaria: Secondary | ICD-10-CM | POA: Diagnosis not present

## 2019-10-09 DIAGNOSIS — L503 Dermatographic urticaria: Secondary | ICD-10-CM | POA: Diagnosis not present

## 2019-10-25 DIAGNOSIS — G43709 Chronic migraine without aura, not intractable, without status migrainosus: Secondary | ICD-10-CM | POA: Diagnosis not present

## 2019-10-31 DIAGNOSIS — G43709 Chronic migraine without aura, not intractable, without status migrainosus: Secondary | ICD-10-CM | POA: Diagnosis not present

## 2020-01-19 DIAGNOSIS — M26609 Unspecified temporomandibular joint disorder, unspecified side: Secondary | ICD-10-CM | POA: Diagnosis not present

## 2020-01-23 DIAGNOSIS — G43709 Chronic migraine without aura, not intractable, without status migrainosus: Secondary | ICD-10-CM | POA: Diagnosis not present

## 2020-02-06 DIAGNOSIS — G43719 Chronic migraine without aura, intractable, without status migrainosus: Secondary | ICD-10-CM | POA: Diagnosis not present

## 2020-02-06 DIAGNOSIS — G43709 Chronic migraine without aura, not intractable, without status migrainosus: Secondary | ICD-10-CM | POA: Diagnosis not present

## 2020-02-19 DIAGNOSIS — Z01419 Encounter for gynecological examination (general) (routine) without abnormal findings: Secondary | ICD-10-CM | POA: Diagnosis not present

## 2020-02-19 DIAGNOSIS — Z3040 Encounter for surveillance of contraceptives, unspecified: Secondary | ICD-10-CM | POA: Diagnosis not present

## 2020-02-19 DIAGNOSIS — Z803 Family history of malignant neoplasm of breast: Secondary | ICD-10-CM | POA: Diagnosis not present

## 2020-02-19 DIAGNOSIS — Z1231 Encounter for screening mammogram for malignant neoplasm of breast: Secondary | ICD-10-CM | POA: Diagnosis not present

## 2020-02-19 DIAGNOSIS — N939 Abnormal uterine and vaginal bleeding, unspecified: Secondary | ICD-10-CM | POA: Diagnosis not present

## 2020-03-06 DIAGNOSIS — N939 Abnormal uterine and vaginal bleeding, unspecified: Secondary | ICD-10-CM | POA: Diagnosis not present

## 2020-04-22 DIAGNOSIS — G43709 Chronic migraine without aura, not intractable, without status migrainosus: Secondary | ICD-10-CM | POA: Diagnosis not present

## 2020-05-21 DIAGNOSIS — G43719 Chronic migraine without aura, intractable, without status migrainosus: Secondary | ICD-10-CM | POA: Diagnosis not present

## 2020-05-22 DIAGNOSIS — Z13220 Encounter for screening for lipoid disorders: Secondary | ICD-10-CM | POA: Diagnosis not present

## 2020-05-22 DIAGNOSIS — Z0000 Encounter for general adult medical examination without abnormal findings: Secondary | ICD-10-CM | POA: Diagnosis not present

## 2020-05-22 DIAGNOSIS — E039 Hypothyroidism, unspecified: Secondary | ICD-10-CM | POA: Diagnosis not present

## 2020-06-18 DIAGNOSIS — N939 Abnormal uterine and vaginal bleeding, unspecified: Secondary | ICD-10-CM | POA: Diagnosis not present

## 2020-07-10 DIAGNOSIS — G43709 Chronic migraine without aura, not intractable, without status migrainosus: Secondary | ICD-10-CM | POA: Diagnosis not present

## 2020-08-27 ENCOUNTER — Ambulatory Visit: Payer: Self-pay

## 2020-08-27 ENCOUNTER — Ambulatory Visit (INDEPENDENT_AMBULATORY_CARE_PROVIDER_SITE_OTHER): Payer: BC Managed Care – PPO | Admitting: Family Medicine

## 2020-08-27 ENCOUNTER — Other Ambulatory Visit: Payer: Self-pay

## 2020-08-27 DIAGNOSIS — M533 Sacrococcygeal disorders, not elsewhere classified: Secondary | ICD-10-CM | POA: Diagnosis not present

## 2020-08-27 DIAGNOSIS — G43709 Chronic migraine without aura, not intractable, without status migrainosus: Secondary | ICD-10-CM | POA: Diagnosis not present

## 2020-08-27 DIAGNOSIS — G43109 Migraine with aura, not intractable, without status migrainosus: Secondary | ICD-10-CM | POA: Diagnosis not present

## 2020-08-27 DIAGNOSIS — G43909 Migraine, unspecified, not intractable, without status migrainosus: Secondary | ICD-10-CM | POA: Insufficient documentation

## 2020-08-27 MED ORDER — METHYLPREDNISOLONE 4 MG PO TBPK: ORAL_TABLET | ORAL | 0 refills | Status: DC

## 2020-08-27 MED ORDER — DICLOFENAC SODIUM 1 % EX GEL: 4.0000 g | Freq: Four times a day (QID) | CUTANEOUS | 6 refills | Status: AC | PRN

## 2020-08-27 NOTE — Progress Notes (Signed)
Office Visit Note   Patient: Sherry Johnston           Date of Birth: 1976/01/16           MRN: [id number] Visit Date: 08/27/2020 Requested by: Dorothyann Peng, MD [address] [place],  [place] [address] PCP: Dorothyann Peng, MD  Subjective: Chief Complaint  Patient presents with  . Other    Coccyx pain x 2 months. No recent injury. H/o cracking her tailbone from a fall around age 45. Hurts to sit with direct pressure on the tailbone. Hurts when she stands up after sitting.    HPI: She is here with tailbone pain.  Symptoms started about 2 months ago, no injury.  Pain is gotten worse to the point that she is in tears when she has to sit too long.  She recently returned from a trip to [place] and has been in the substantial pain.  No radicular symptoms, no fevers or chills, no skin lesion in that area.  No difficulty with bowel movements.  She has tried a donut cushion with minimal improvement.  She feels better when she stands or lies down.  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  She cannot take NSAIDs due to stomach issues.  She has chronic migraines.  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  She had negative rheumatology work-up.              ROS:   All other systems were reviewed and are negative.  Objective: Vital Signs: There were no vitals taken for this visit.  Physical Exam:  General:  Alert and oriented, in no acute distress. Pulm:  Breathing unlabored. Psy:  Normal mood, congruent affect. Female chaperone was present. Sacrum/coccyx: She is very tender to palpation over the coccyx in the midline.  There is mild tenderness near the SI joints, no tenderness along the lumbar spine.    Imaging: XR Sacrum/Coccyx  Result Date: 08/27/2020 X-rays of the  sacrum and coccyx reveal mild degenerative change in the coccyx.  No sign of stress fracture or neoplasm.  Alignment is anatomic.   Assessment & Plan: 1.  Tailbone pain, suspect due to osteoarthritis -We will try glucosamine, turmeric, Voltaren gel.  Medrol pack if she fails to improve.  MRI if nothing seems to help.     Procedures: No procedures performed        PMFS History: Patient Active Problem List   Diagnosis Date Noted  . Migraine 08/27/2020  . Family history of breast cancer 10/08/2015  . Chronic migraine without aura 11/21/2013  . SVD (spontaneous vaginal delivery) 04/19/2012  . Flu vaccine need 02/25/2012  . Elevated glucose tolerance test 01/25/2012  . Infertility, female 10/13/2011  . History of ectopic pregnancy 09/17/2011  . Hypothyroidism 09/17/2011  . H/O: depression 09/17/2011  . Smoker 09/17/2011   Past Medical History:  Diagnosis Date  . Acute mastitis of left breast 05/24/08  . Allergy   . Anxiety   . Bladder infection   . Chronic headaches    since age 62  . Constipation 11/24/06  . Dengue fever    2002  . Depression   . Ectopic pregnancy 11/26/06  . H/O candidiasis   . H/O urinary frequency 09/13/07  .  History of bulimia   . History of shingles    17 yrs. old ?  Marland Kitchen Hx of migraines   . Hypothyroidism    H/O  . Infertility, female 12/25/09  . Nausea 11/24/06  . Spotting in first trimester 11/22/06    @ 4-5 weeks    Family History  Problem Relation Age of Onset  . Cancer Mother        breast  . Cancer Father        prostate  . Alcohol abuse Father   . Hyperlipidemia Father   . Mental illness Brother        ADHD  . COPD Maternal Grandmother        emphysema  . Cancer Maternal Grandmother        thyroid and colon  . Cancer Paternal Grandfather        bladder  . Heart disease Paternal Grandmother   . Hypertension Paternal Grandmother   . Hyperlipidemia Paternal Grandmother   . Mental illness Maternal Grandfather     Past Surgical  History:  Procedure Laterality Date  . ECTOPIC PREGNANCY SURGERY    . MANDIBLE FRACTURE SURGERY    . SALPINGECTOMY     right   Social History   Occupational History  . Not on file  Tobacco Use  . Smoking status: Never Smoker  . Smokeless tobacco: Never Used  Substance and Sexual Activity  . Alcohol use: No    Alcohol/week: 0.0 standard drinks  . Drug use: No  . Sexual activity: Yes    Birth control/protection: None

## 2020-08-27 NOTE — Patient Instructions (Signed)
    Glucosamine Sulfate:  1,000 mg twice daily  Turmeric:  500 mg twice daily   Voltaren gel 4 times daily as needed

## 2020-10-25 DIAGNOSIS — G43109 Migraine with aura, not intractable, without status migrainosus: Secondary | ICD-10-CM | POA: Diagnosis not present

## 2020-10-30 DIAGNOSIS — S89312A Salter-Harris Type I physeal fracture of lower end of left fibula, initial encounter for closed fracture: Secondary | ICD-10-CM | POA: Diagnosis not present

## 2020-10-30 DIAGNOSIS — S99911A Unspecified injury of right ankle, initial encounter: Secondary | ICD-10-CM | POA: Diagnosis not present

## 2020-11-06 DIAGNOSIS — M25571 Pain in right ankle and joints of right foot: Secondary | ICD-10-CM | POA: Diagnosis not present

## 2020-11-26 DIAGNOSIS — G43109 Migraine with aura, not intractable, without status migrainosus: Secondary | ICD-10-CM | POA: Diagnosis not present

## 2021-05-09 DIAGNOSIS — H0288B Meibomian gland dysfunction left eye, upper and lower eyelids: Secondary | ICD-10-CM | POA: Diagnosis not present

## 2021-05-09 DIAGNOSIS — H0288A Meibomian gland dysfunction right eye, upper and lower eyelids: Secondary | ICD-10-CM | POA: Diagnosis not present

## 2021-05-09 DIAGNOSIS — H16223 Keratoconjunctivitis sicca, not specified as Sjogren's, bilateral: Secondary | ICD-10-CM | POA: Diagnosis not present

## 2021-05-09 DIAGNOSIS — H16142 Punctate keratitis, left eye: Secondary | ICD-10-CM | POA: Diagnosis not present

## 2021-05-30 DIAGNOSIS — G43109 Migraine with aura, not intractable, without status migrainosus: Secondary | ICD-10-CM | POA: Diagnosis not present

## 2021-06-13 DIAGNOSIS — H0288A Meibomian gland dysfunction right eye, upper and lower eyelids: Secondary | ICD-10-CM | POA: Diagnosis not present

## 2021-06-13 DIAGNOSIS — H0288B Meibomian gland dysfunction left eye, upper and lower eyelids: Secondary | ICD-10-CM | POA: Diagnosis not present

## 2021-06-13 DIAGNOSIS — H16223 Keratoconjunctivitis sicca, not specified as Sjogren's, bilateral: Secondary | ICD-10-CM | POA: Diagnosis not present

## 2021-06-30 ENCOUNTER — Other Ambulatory Visit: Payer: Self-pay

## 2021-06-30 ENCOUNTER — Ambulatory Visit (INDEPENDENT_AMBULATORY_CARE_PROVIDER_SITE_OTHER): Payer: BC Managed Care – PPO | Admitting: Obstetrics and Gynecology

## 2021-06-30 ENCOUNTER — Encounter: Payer: Self-pay | Admitting: Obstetrics and Gynecology

## 2021-06-30 VITALS — BP 120/84 | HR 85 | Ht 63.5 in | Wt 186.0 lb

## 2021-06-30 DIAGNOSIS — N926 Irregular menstruation, unspecified: Secondary | ICD-10-CM | POA: Diagnosis not present

## 2021-06-30 DIAGNOSIS — R7989 Other specified abnormal findings of blood chemistry: Secondary | ICD-10-CM | POA: Diagnosis not present

## 2021-06-30 DIAGNOSIS — G43829 Menstrual migraine, not intractable, without status migrainosus: Secondary | ICD-10-CM

## 2021-06-30 NOTE — Progress Notes (Signed)
GYNECOLOGY  VISIT   HPI: 46 y.o.   Married  Caucasian  female   [phone] with Patient's last menstrual period was 06/30/2021 (exact date).   here for irregular bleeding. Patient takes progesterone fairly regularly but if she misses dose by a few days, she has constant bleeding. She has been seeing Dr. Cletis Media and would like another opinion.   Taking Prometrium 200 mg 14 days per month for low progesterone level. Taking this since 2001, but used for 12 days in the past.  If she is off the Prometrium, her period is ongoing.  With the regular use of Prometrium, she has a cycle for 5 - 7 days. No excessive bleeding.  Using Diva cup.    She had a normal pelvic US per patient.  Generally not feeling well, and wants to feel good again.   Having muscle and joint pain.  Tested negative for rheumatoid arthritis through her PCP.   Has migraines impacted by her hormones.  Has a headache prior to her period starting.  Some HA during her cycle and after.  Sees neurology at [facility] in [place].   Feels like her PMS is worse.  Experiencing anxiety. Increased acne.   Decreased libido.   Normal thyroid function.   Not having hot flashes.   Hx infertility, IVF, and ectopic pregnancy.   Declines birth control due to emotional changes and hx of migraine with aura.   GYNECOLOGIC HISTORY: Patient's last menstrual period was 06/30/2021 (exact date). Contraception:  Withdrawal Menopausal hormone therapy:  Progesterone Last mammogram:  03/2020 normal per patient with [facility] Last pap smear:  2021 normal per patient--thinks it was done at this time        OB History     Gravida  5   Para  2   Term  2   Preterm  0   AB  3   Living  2      SAB  0   IAB  0   Ectopic  3   Multiple  0   Live Births  1              Patient Active Problem List   Diagnosis Date Noted   Migraine 08/27/2020   Family history of breast cancer 10/08/2015   Chronic  migraine without aura 11/21/2013   SVD (spontaneous vaginal delivery) 04/19/2012   Flu vaccine need 02/25/2012   Elevated glucose tolerance test 01/25/2012   Infertility, female 10/13/2011   History of ectopic pregnancy 09/17/2011   Hypothyroidism 09/17/2011   H/O: depression 09/17/2011   Smoker 09/17/2011    Past Medical History:  Diagnosis Date   Acute mastitis of left breast 05/24/2008   Allergy    Anxiety    Bladder infection    Chronic headaches    since age 59   Constipation 11/24/2006   Dengue fever    2002   Depression    Ectopic pregnancy 11/26/2006   H/O candidiasis    H/O urinary frequency 09/13/2007   History of bulimia    History of shingles    17 yrs. old ?   Hx of migraines    Hx of migraines    with aura   Hypothyroidism    H/O   Infertility, female 12/25/2009   Nausea 11/24/2006   Spotting in first trimester 11/22/2006    @ 4-5 weeks    Past Surgical History:  Procedure Laterality Date   ECTOPIC PREGNANCY SURGERY  MANDIBLE FRACTURE SURGERY     SALPINGECTOMY     right    Current Outpatient Medications  Medication Sig Dispense Refill   acetaminophen (TYLENOL) 500 MG tablet Take 1,000 mg by mouth every 6 (six) hours as needed. Reported on 05/03/2015     diclofenac Sodium (VOLTAREN) 1 % GEL Apply 4 g topically 4 (four) times daily as needed. 500 g 6   eletriptan (RELPAX) 40 MG tablet Take 40 mg by mouth as needed for migraine or headache. May repeat in 2 hours if headache persists or recurs.     methocarbamol (ROBAXIN) 750 MG tablet Take by mouth.     ondansetron (ZOFRAN-ODT) 8 MG disintegrating tablet ondansetron 8 mg disintegrating tablet     progesterone (PROMETRIUM) 200 MG capsule Take by mouth.     TYRVAYA 0.03 MG/ACT SOLN Place into both nostrils.     valACYclovir (VALTREX) 500 MG tablet Take by mouth.     zonisamide (ZONEGRAN) 100 MG capsule Take 100 mg by mouth at bedtime.     zonisamide (ZONEGRAN) 25 MG capsule Take by mouth.      zonisamide (ZONEGRAN) 25 MG capsule Take 25 mg by mouth at bedtime.     No current facility-administered medications for this visit.     ALLERGIES: Ibuprofen, Tape, Wound dressing adhesive, and Imitrex [sumatriptan]  Family History  Problem Relation Age of Onset   Cancer Mother        breast   Cancer Father        prostate   Alcohol abuse Father    Hyperlipidemia Father    Mental illness Brother        ADHD   COPD Maternal Grandmother        emphysema   Cancer Maternal Grandmother        thyroid and colon   Mental illness Maternal Grandfather    Diabetes Paternal Grandmother    Heart disease Paternal Grandmother    Hypertension Paternal Grandmother    Hyperlipidemia Paternal Grandmother    Cancer Paternal Grandfather        bladder    Social History   Socioeconomic History   Marital status: Married    Spouse name: Not on file   Number of children: Not on file   Years of education: Not on file   Highest education level: Not on file  Occupational History   Not on file  Tobacco Use   Smoking status: Former    Types: Cigarettes   Smokeless tobacco: Never   Tobacco comments:    Quit age 74  Vaping Use   Vaping Use: Never used  Substance and Sexual Activity   Alcohol use: No    Alcohol/week: 0.0 standard drinks   Drug use: No   Sexual activity: Yes    Birth control/protection: Other-see comments    Comment: withdrawal  Other Topics Concern   Not on file  Social History Narrative   Not on file   Social Determinants of Health   Financial Resource Strain: Not on file  Food Insecurity: Not on file  Transportation Needs: Not on file  Physical Activity: Not on file  Stress: Not on file  Social Connections: Not on file  Intimate Partner Violence: Not on file    Review of Systems  Constitutional:  Positive for fatigue and unexpected weight change (weight gain).  Musculoskeletal:  Positive for arthralgias and joint swelling (joint pain).  All other systems  reviewed and are negative.  PHYSICAL EXAMINATION:  BP 120/84    Pulse 85    Ht 5' 3.5" (1.613 m)    Wt 186 lb (84.4 kg)    LMP 06/30/2021 (Exact Date)    SpO2 99%    BMI 32.43 kg/m     General appearance: alert, cooperative and appears stated age Head: Normocephalic, without obvious abnormality, atraumatic Neck: no adenopathy, supple, symmetrical, trachea midline and thyroid normal to inspection and palpation Lungs: clear to auscultation bilaterally Heart: regular rate and rhythm Abdomen: soft, non-tender, no masses,  no organomegaly Extremities: extremities normal, atraumatic, no cyanosis or edema Skin: Skin color, texture, turgor normal. No rashes or lesions No abnormal inguinal nodes palpated Neurologic: Grossly normal  Pelvic: External genitalia:  no lesions              Urethra:  normal appearing urethra with no masses, tenderness or lesions              Bartholins and Skenes: normal                 Vagina: normal appearing vagina with normal color and discharge, no lesions              Cervix: no lesions.  Menstrual flow.                 Bimanual Exam:  Uterus:  normal size, contour, position, consistency, mobility, non-tender              Adnexa: no mass, fullness, tenderness              Rectal exam: yes.  Confirms.              Anus:  normal sphincter tone, no lesions  Chaperone was present for exam:  Terra, CM  ASSESSMENT  Irregular menses with low progesterone level.  Likely anovulatory cycles. On cyclic Prometrium.  Hx migraine with aura. PMDD symptoms. FH breast cancer in mother.  Status post right salpingectomy due to ectopic pregnancy.  PLAN  We discussed anovulatory cycles and PMDD.  Will get [place] report from prior office.  Return for Mirena IUD and estrogen patch.   Risks and benefits of Mirena and cyclic estradiol patch for treatment of menstrual headaches discussed.  Brochure on Mirena to patient. We also reviewed SSRI medication for treatment of PMDD  but will start with change of progesterone therapy to method that will deliver less progesterone hormone systemically.  Declines ablation and hysterectomy.  Update mammogram.    An After Visit Summary was printed and given to the patient.  37min  total time was spent for this patient encounter, including preparation, face-to-face counseling with the patient, coordination of care, and documentation of the encounter.

## 2021-07-03 ENCOUNTER — Other Ambulatory Visit: Payer: Self-pay | Admitting: *Deleted

## 2021-07-03 DIAGNOSIS — Z30430 Encounter for insertion of intrauterine contraceptive device: Secondary | ICD-10-CM

## 2021-07-25 ENCOUNTER — Telehealth: Payer: Self-pay

## 2021-07-25 NOTE — Telephone Encounter (Signed)
Her IUD can be coded under contraception.  ? ?As far as pain goes, I plan to use a paracervical block with lidocaine, which should make the insertion of the IUD tolerable.  ? ?For nerves, she can take Ativan 1 mg one hour prior to procedure, but she will need to sign her consent form in advance and have a driver to and from the appointment.  ?Disp:  2 ?RF:  none. ?

## 2021-07-25 NOTE — Telephone Encounter (Signed)
Pt has IUD insertion appt for 07/28/21 with Dr. Edward Jolly. She is calling to inquire if there were any other recommendations as far as a medication she could take prior to appt to help with nerves/pain. States advil gives her an upset stomach. Please advise.  ?

## 2021-07-25 NOTE — Telephone Encounter (Signed)
Pt notified and voiced understanding. However, reports that she has struggled with migraines most of her life and has come to find that most of anything OTC/pain related is not tolerated well (GI).  ? ?Told her that I would ask you if there was anything Rx wise you would recommend she try and if not, we will send to Dr. Edward Jolly and hopefully get a response early Monday morning. Pt voiced understanding, states she is still waiting on benefits to let her know Monday morning as well if her insurance will even cover IUD procedure. Thanks.  ?

## 2021-07-25 NOTE — Telephone Encounter (Signed)
Please send to Dr Quincy Simmonds for recommendations.  ?

## 2021-07-25 NOTE — Telephone Encounter (Signed)
FYI. Pt notified and voiced understanding. Inevitably pt decided to decline ativan. But was happy to hear about the paracervical block.  ?

## 2021-07-25 NOTE — Telephone Encounter (Signed)
She can take extra strength tylenol.  ?

## 2021-07-28 ENCOUNTER — Telehealth: Payer: Self-pay | Admitting: *Deleted

## 2021-07-28 ENCOUNTER — Encounter: Payer: Self-pay | Admitting: Obstetrics and Gynecology

## 2021-07-28 ENCOUNTER — Ambulatory Visit (INDEPENDENT_AMBULATORY_CARE_PROVIDER_SITE_OTHER): Payer: BC Managed Care – PPO | Admitting: Obstetrics and Gynecology

## 2021-07-28 ENCOUNTER — Telehealth: Payer: Self-pay

## 2021-07-28 ENCOUNTER — Other Ambulatory Visit: Payer: Self-pay

## 2021-07-28 VITALS — BP 120/78 | HR 97 | Ht 63.5 in | Wt 186.0 lb

## 2021-07-28 DIAGNOSIS — Z30430 Encounter for insertion of intrauterine contraceptive device: Secondary | ICD-10-CM

## 2021-07-28 DIAGNOSIS — Z30431 Encounter for routine checking of intrauterine contraceptive device: Secondary | ICD-10-CM

## 2021-07-28 DIAGNOSIS — Z01812 Encounter for preprocedural laboratory examination: Secondary | ICD-10-CM | POA: Diagnosis not present

## 2021-07-28 LAB — PREGNANCY, URINE: Preg Test, Ur: NEGATIVE

## 2021-07-28 NOTE — Patient Instructions (Signed)
Intrauterine Device Insertion, Care After This sheet gives you information about how to care for yourself after your procedure. Your health care provider may also give you more specific instructions. If you have problems or questions, contact your health care provider. What can I expect after the procedure? After the procedure, it is common to have: Cramps and pain in the abdomen. Bleeding. It may be light or heavy. This may last for a few days. Lower back pain. Dizziness. Headaches. Nausea. Follow these instructions at home:  Before resuming sexual activity, check to make sure that you can feel the IUD string or strings. You should be able to feel the end of the string below the opening of your cervix. If your IUD string is in place, you may resume sexual activity. If you had a hormonal IUD inserted more than 7 days after your most recent period started, you will need to use a backup method of birth control for 7 days after IUD insertion. Ask your health care provider whether this applies to you. Continue to check that the IUD is still in place by feeling for the strings after every menstrual period, or once a month. An IUD will not protect you from sexually transmitted infections (STIs). Use methods to prevent the exchange of body fluids between partners (barrier protection) every time you have sex. Barrier protection can be used during oral, vaginal, or anal sex. Commonly used barrier methods include: Female condom. Female condom. Dental dam. Take over-the-counter and prescription medicines only as told by your health care provider. Keep all follow-up visits as told by your health care provider. This is important. Contact a health care provider if: You feel light-headed or weak. You have any of the following problems with your IUD string or strings: The string bothers or hurts you or your sexual partner. You cannot feel the string. The string has gotten longer. You can feel the IUD in  your vagina. You think you may be pregnant, or you miss your menstrual period. You think you may have a sexually transmitted infection (STI). Get help right away if: You have flu-like symptoms, such as tiredness (fatigue) and muscle aches. You have a fever and chills. You have bleeding that is heavier or lasts longer than a normal menstrual cycle. You have abnormal or bad-smelling discharge from your vagina. You develop abdominal pain that is new, is getting worse, or is not in the same area of earlier cramping and pain. You have pain during sexual activity. Summary After the procedure, it is common to have cramps and pain in the abdomen. It is also common to have light bleeding or heavier bleeding that is like your menstrual period. Continue to check that the IUD is still in place by feeling for the strings after every menstrual period, or once a month. Keep all follow-up visits as told by your health care provider. This is important. Contact your health care provider if you have problems with your IUD strings, such as the string getting longer or bothering you or your sexual partner. This information is not intended to replace advice given to you by your health care provider. Make sure you discuss any questions you have with your health care provider. Document Revised: 04/11/2019 Document Reviewed: 04/11/2019 Elsevier Patient Education  2022 Elsevier Inc.  

## 2021-07-28 NOTE — Telephone Encounter (Signed)
Patient called stating she is scheduled for IUD insertion at 11:30 today. She is not on cycle. Spoke with Dr.Silva and patient is not on birth control and if not on cycle, should have abstained from intercourse for the last 2 weeks or needs to r/s IUD insertion.  Called patient and explained above. She states she had abstained and advised to keep appointment.

## 2021-07-28 NOTE — Progress Notes (Signed)
GYNECOLOGY  VISIT ?  ?HPI: ?46 y.o.   Married  Caucasian  female   ?[id number] with Patient's last menstrual period was 06/30/2021 (exact date).   ?here for Mirena IUD insertion.   ? ?Patient desires contraception. ?She has had irregular bleeding treated with Prometrium in the past.  ?She has had symptoms of PMS, acne, and anxiety.  ? ?She does not take Tylenol or NSAIDs because it causes stomach upset.  ? ?Asking about Rx for transdermal estrogen for migraine headaches.  ? ?UPT negative.  ? ?GYNECOLOGIC HISTORY: ?Patient's last menstrual period was 06/30/2021 (exact date). ?Contraception:  Withdrawal ?Menopausal hormone therapy:  Progesterone ?Last mammogram:  03/2020 normal per patient with [facility] ?Last pap smear:  2021 normal per patient--thinks it was done at this time ?       ?OB History   ? ? Gravida  ?5  ? Para  ?2  ? Term  ?2  ? Preterm  ?0  ? AB  ?3  ? Living  ?2  ?  ? ? SAB  ?0  ? IAB  ?0  ? Ectopic  ?3  ? Multiple  ?0  ? Live Births  ?1  ?   ?  ?  ?    ? ?Patient Active Problem List  ? Diagnosis Date Noted  ? Migraine 08/27/2020  ? Family history of breast cancer 10/08/2015  ? Chronic migraine without aura 11/21/2013  ? SVD (spontaneous vaginal delivery) 04/19/2012  ? Flu vaccine need 02/25/2012  ? Elevated glucose tolerance test 01/25/2012  ? Infertility, female 10/13/2011  ? History of ectopic pregnancy 09/17/2011  ? Hypothyroidism 09/17/2011  ? H/O: depression 09/17/2011  ? Smoker 09/17/2011  ? ? ?Past Medical History:  ?Diagnosis Date  ? Acute mastitis of left breast 05/24/2008  ? Allergy   ? Anxiety   ? Bladder infection   ? Chronic headaches   ? since age 39  ? Constipation 11/24/2006  ? 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 Depression   ? Ectopic pregnancy 11/26/2006  ? H/O candidiasis   ? H/O urinary frequency 09/13/2007  ? History of bulimia   ? History of shingles   ? 17 yrs. old ?  ? Hx of migraines   ? Hx of migraines   ? with aura  ? Hypothyroidism   ? H/O  ? Infertility, female 12/25/2009  ?  Nausea 11/24/2006  ? Spotting in first trimester 11/22/2006  ?  @ 4-5 weeks  ? ? ?Past Surgical History:  ?Procedure Laterality Date  ? ECTOPIC PREGNANCY SURGERY    ? MANDIBLE FRACTURE SURGERY    ? SALPINGECTOMY    ? right  ? ? ?Current Outpatient Medications  ?Medication Sig Dispense Refill  ? acetaminophen (TYLENOL) 500 MG tablet Take 1,000 mg by mouth every 6 (six) hours as needed. Reported on 05/03/2015    ? diclofenac Sodium (VOLTAREN) 1 % GEL Apply 4 g topically 4 (four) times daily as needed. 500 g 6  ? eletriptan (RELPAX) 40 MG tablet Take 40 mg by mouth as needed for migraine or headache. May repeat in 2 hours if headache persists or recurs.    ? methocarbamol (ROBAXIN) 750 MG tablet Take by mouth.    ? ondansetron (ZOFRAN-ODT) 8 MG disintegrating tablet ondansetron 8 mg disintegrating tablet    ? TYRVAYA 0.03 MG/ACT SOLN Place into both nostrils.    ? valACYclovir (VALTREX) 500 MG tablet Take by mouth.    ? zonisamide (ZONEGRAN) 100  MG capsule Take 100 mg by mouth at bedtime.    ? zonisamide (ZONEGRAN) 25 MG capsule Take by mouth.    ? zonisamide (ZONEGRAN) 25 MG capsule Take 25 mg by mouth at bedtime.    ? ?No current facility-administered medications for this visit.  ?  ? ?ALLERGIES: Ibuprofen, Tape, Wound dressing adhesive, Rizatriptan, and Sumatriptan ? ?Family History  ?Problem Relation Age of Onset  ? Cancer Mother   ?     breast  ? Cancer Father   ?     prostate  ? Alcohol abuse Father   ? Hyperlipidemia Father   ? Mental illness Brother   ?     ADHD  ? COPD Maternal Grandmother   ?     emphysema  ? Cancer Maternal Grandmother   ?     thyroid and colon  ? Mental illness Maternal Grandfather   ? Diabetes Paternal Grandmother   ? Heart disease Paternal Grandmother   ? Hypertension Paternal Grandmother   ? Hyperlipidemia Paternal Grandmother   ? Cancer Paternal Grandfather   ?     bladder  ? ? ?Social History  ? ?Socioeconomic History  ? Marital status: Married  ?  Spouse name: Not on file  ? Number of  children: Not on file  ? Years of education: Not on file  ? Highest education level: Not on file  ?Occupational History  ? Not on file  ?Tobacco Use  ? Smoking status: Former  ?  Types: Cigarettes  ? Smokeless tobacco: Never  ? Tobacco comments:  ?  Quit age 33  ?Vaping Use  ? Vaping Use: Never used  ?Substance and Sexual Activity  ? Alcohol use: No  ?  Alcohol/week: 0.0 standard drinks  ? Drug use: No  ? Sexual activity: Yes  ?  Birth control/protection: Other-see comments  ?  Comment: withdrawal  ?Other Topics Concern  ? Not on file  ?Social History Narrative  ? Not on file  ? ?Social Determinants of Health  ? ?Financial Resource Strain: Not on file  ?Food Insecurity: Not on file  ?Transportation Needs: Not on file  ?Physical Activity: Not on file  ?Stress: Not on file  ?Social Connections: Not on file  ?Intimate Partner Violence: Not on file  ? ? ?Review of Systems  ?All other systems reviewed and are negative. ? ?PHYSICAL EXAMINATION:   ? ?BP 120/78   Pulse 97   Ht 5' 3.5" (1.613 m)   Wt 186 lb (84.4 kg)   LMP 06/30/2021 (Exact Date)   SpO2 (!) 82%   BMI 32.43 kg/m?     ?General appearance: alert, cooperative and appears stated age ?  ?Pelvic: External genitalia:  no lesions ?             Urethra:  normal appearing urethra with no masses, tenderness or lesions ?             Bartholins and Skenes: normal    ?             Vagina: normal appearing vagina with normal color and discharge, no lesions ?             Cervix: no lesions ?               ?Bimanual Exam:  Uterus:  normal size, contour, position, consistency, mobility, non-tender ?             Adnexa: no mass, fullness, tenderness ?            ?  Mirena IUD placement.  Lot [id number], exp March 2025. ?Hibiclens prep.  ?Paracervical block with 10 cc 1% lidocaine, lot [id number], exp 09/02/22.  ?Tenaculum to anterior and then posterior cervix.  ?Os finder and tenaculum used to measure uterine length.   ?Mirena placed to 6.5 cm.  ?Strings trimmed and shown to  patient.  ?No complications.  ?EBL 15 cc.  ?Silver nitrate used to treat bleeding from tenaculum site on cervix.  ? ?Chaperone was present for exam:  Estill Bamberg, CMA ? ?ASSESSMENT ? ?Mirena IUD placement.  ? ?PLAN ? ?IUD card to patient.  ?Post IUD placement precautions.  ?Back up pregnancy prevention for 2 weeks.  ?Follow up this week for pelvic US to check IUD position.  ?  ?An After Visit Summary was printed and given to the patient. ? ?  ? ?

## 2021-07-28 NOTE — Telephone Encounter (Signed)
Patient called post IUD insertion today, scheduled for tomorrow for IUD ultrasound check @ 11:30am. Patient asked if she can have medication to help keep her calm? Patient said it was offered at visit, but she declined at the time. Patient thought about it and now would like Rx now. Patient said she is very anxious that if the IUD is not in correct position she can't have it repositioned with meds. Please advise  ?

## 2021-07-28 NOTE — Telephone Encounter (Signed)
[place]oppel for xanax 0.25 mg po tid prn.  ?Disp:  5 ?RF:  none.  ? ?She will need a driver if she takes the Xanax prior to her office visit.  ?

## 2021-07-29 ENCOUNTER — Ambulatory Visit (INDEPENDENT_AMBULATORY_CARE_PROVIDER_SITE_OTHER): Payer: BC Managed Care – PPO | Admitting: Obstetrics and Gynecology

## 2021-07-29 ENCOUNTER — Ambulatory Visit (INDEPENDENT_AMBULATORY_CARE_PROVIDER_SITE_OTHER): Payer: BC Managed Care – PPO

## 2021-07-29 ENCOUNTER — Telehealth: Payer: Self-pay | Admitting: Obstetrics and Gynecology

## 2021-07-29 DIAGNOSIS — Z30431 Encounter for routine checking of intrauterine contraceptive device: Secondary | ICD-10-CM

## 2021-07-29 DIAGNOSIS — N8003 Adenomyosis of the uterus: Secondary | ICD-10-CM

## 2021-07-29 MED ORDER — ALPRAZOLAM 0.25 MG PO TABS: 0.2500 mg | ORAL_TABLET | Freq: Three times a day (TID) | ORAL | 0 refills | Status: DC | PRN

## 2021-07-29 NOTE — Telephone Encounter (Signed)
Order placed and pelvic u/s scheduled at [facility] for 07/30/21 at 3:00pm to arrive at 2:40pm.  Patient was provided address and all instructions.  ?

## 2021-07-29 NOTE — Telephone Encounter (Signed)
Please schedule pelvic US at [facility] to check IUD location.  ? ?Patient had an ultrasound in office today, and it was not possible to confirm if the IUD was at the level of the uterine fundus or not.  Possible adenomyosis seen at the fundus.  ? ?Uterus is very retroverted.  ? ? ?

## 2021-07-29 NOTE — Progress Notes (Signed)
GYNECOLOGY  VISIT ?  ?HPI: ?46 y.o.   Married  Caucasian  female   ?[id number] with Patient's last menstrual period was 06/30/2021 (exact date).   ?here for pelvic ultrasound to check IUD position.   ? ?Mirena IUD inserted yesterday.  ? ?Patient has a sensation of something in the vagina.  ? ?GYNECOLOGIC HISTORY: ?Patient's last menstrual period was 06/30/2021 (exact date). ?Contraception:  Mirena IUD 07-28-21 ?Menopausal hormone therapy:  n/a ?Last mammogram:  03[facility]2020 [facility] ?Last pap smear:   2021 normal per patient--thinks it was done at this time ?       ?OB History   ? ? Gravida  ?5  ? Para  ?2  ? Term  ?2  ? Preterm  ?0  ? AB  ?3  ? Living  ?2  ?  ? ? SAB  ?0  ? IAB  ?0  ? Ectopic  ?3  ? Multiple  ?0  ? Live Births  ?1  ?   ?  ?  ?    ? ?Patient Active Problem List  ? Diagnosis Date Noted  ? Migraine 08/27/2020  ? Family history of breast cancer 10/08/2015  ? Chronic migraine without aura 11/21/2013  ? SVD (spontaneous vaginal delivery) 04/19/2012  ? Flu vaccine need 02/25/2012  ? Elevated glucose tolerance test 01/25/2012  ? Infertility, female 10/13/2011  ? History of ectopic pregnancy 09/17/2011  ? Hypothyroidism 09/17/2011  ? H/O: depression 09/17/2011  ? Smoker 09/17/2011  ? ? ?Past Medical History:  ?Diagnosis Date  ? Acute mastitis of left breast 05/24/2008  ? Allergy   ? Anxiety   ? Bladder infection   ? Chronic headaches   ? since age 89  ? Constipation 11/24/2006  ? 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 Depression   ? Ectopic pregnancy 11/26/2006  ? H/O candidiasis   ? H/O urinary frequency 09/13/2007  ? History of bulimia   ? History of shingles   ? 17 yrs. old ?  ? Hx of migraines   ? Hx of migraines   ? with aura  ? Hypothyroidism   ? H/O  ? Infertility, female 12/25/2009  ? Nausea 11/24/2006  ? Spotting in first trimester 11/22/2006  ?  @ 4-5 weeks  ? ? ?Past Surgical History:  ?Procedure Laterality Date  ? ECTOPIC PREGNANCY SURGERY    ? MANDIBLE FRACTURE SURGERY    ?  SALPINGECTOMY    ? right  ? ? ?Current Outpatient Medications  ?Medication Sig Dispense Refill  ? acetaminophen (TYLENOL) 500 MG tablet Take 1,000 mg by mouth every 6 (six) hours as needed. Reported on 05/03/2015    ? ALPRAZolam (XANAX) 0.25 MG tablet Take 1 tablet (0.25 mg total) by mouth 3 (three) times daily as needed for anxiety. 5 tablet 0  ? diclofenac Sodium (VOLTAREN) 1 % GEL Apply 4 g topically 4 (four) times daily as needed. 500 g 6  ? eletriptan (RELPAX) 40 MG tablet Take 40 mg by mouth as needed for migraine or headache. May repeat in 2 hours if headache persists or recurs.    ? methocarbamol (ROBAXIN) 750 MG tablet Take by mouth.    ? ondansetron (ZOFRAN-ODT) 8 MG disintegrating tablet ondansetron 8 mg disintegrating tablet    ? TYRVAYA 0.03 MG/ACT SOLN Place into both nostrils.    ? valACYclovir (VALTREX) 500 MG tablet Take by mouth.    ? zonisamide (ZONEGRAN) 100 MG capsule Take 100 mg by  mouth at bedtime.    ? zonisamide (ZONEGRAN) 25 MG capsule Take by mouth.    ? zonisamide (ZONEGRAN) 25 MG capsule Take 25 mg by mouth at bedtime.    ? ?No current facility-administered medications for this visit.  ?  ? ?ALLERGIES: Ibuprofen, Tape, Wound dressing adhesive, Rizatriptan, and Sumatriptan ? ?Family History  ?Problem Relation Age of Onset  ? Cancer Mother   ?     breast  ? Cancer Father   ?     prostate  ? Alcohol abuse Father   ? Hyperlipidemia Father   ? Mental illness Brother   ?     ADHD  ? COPD Maternal Grandmother   ?     emphysema  ? Cancer Maternal Grandmother   ?     thyroid and colon  ? Mental illness Maternal Grandfather   ? Diabetes Paternal Grandmother   ? Heart disease Paternal Grandmother   ? Hypertension Paternal Grandmother   ? Hyperlipidemia Paternal Grandmother   ? Cancer Paternal Grandfather   ?     bladder  ? ? ?Social History  ? ?Socioeconomic History  ? Marital status: Married  ?  Spouse name: Not on file  ? Number of children: Not on file  ? Years of education: Not on file  ?  Highest education level: Not on file  ?Occupational History  ? Not on file  ?Tobacco Use  ? Smoking status: Former  ?  Types: Cigarettes  ? Smokeless tobacco: Never  ? Tobacco comments:  ?  Quit age 47  ?Vaping Use  ? Vaping Use: Never used  ?Substance and Sexual Activity  ? Alcohol use: No  ?  Alcohol/week: 0.0 standard drinks  ? Drug use: No  ? Sexual activity: Yes  ?  Birth control/protection: Other-see comments  ?  Comment: withdrawal  ?Other Topics Concern  ? Not on file  ?Social History Narrative  ? Not on file  ? ?Social Determinants of Health  ? ?Financial Resource Strain: Not on file  ?Food Insecurity: Not on file  ?Transportation Needs: Not on file  ?Physical Activity: Not on file  ?Stress: Not on file  ?Social Connections: Not on file  ?Intimate Partner Violence: Not on file  ? ? ?Review of Systems  See HPI.  ? ?PHYSICAL EXAMINATION:   ? ?LMP 06/30/2021 (Exact Date)     ?General appearance: alert, cooperative and appears stated age ?  ?Pelvic US ?Uterus 9.68 x 6.37 x 6.10 cm.  ?Myometrium is slightly heterogeneous with streaking, possible adenomyosis.  ?IUD in the endometrial canal and appears to be miduterus, unclear if it is at the fundus.  ?No endometrial masses or thickening.  ?Ovaries noral.  ?No adnexal masses.  ?No free fluid.  ? ?ASSESSMENT ? ?IUD check up.  ?Unable to confirm proper placement of the IUD today.   ? ?PLAN ? ?[place] images reviewed in real time with technician scanning at bedside.  ?Will get a second opinion with pelvic US at [facility].  ?If the IUD is too low, I would recommend removal of this IUD and placement of a new IUD under ultrasound guidance if the patient still desires Mirena.  ?If a new IUD is placed, I would recommend Cytotec 200 mcg pv at hs the night before IUD exchange and then morning of the exchange.  ?  ?An After Visit Summary was printed and given to the patient. ? ?20 min  total time was spent for this patient encounter, including preparation,  face-to-face  counseling with the patient, coordination of care, and documentation of the encounter. ? ? ?

## 2021-07-29 NOTE — Telephone Encounter (Signed)
Patient informed with below note. Rx called in.  ?

## 2021-07-30 ENCOUNTER — Other Ambulatory Visit: Payer: BC Managed Care – PPO

## 2021-07-30 ENCOUNTER — Encounter: Payer: Self-pay | Admitting: Obstetrics and Gynecology

## 2021-08-01 ENCOUNTER — Ambulatory Visit
Admission: RE | Admit: 2021-08-01 | Discharge: 2021-08-01 | Disposition: A | Discharge status: Home / Self Care | Payer: BC Managed Care – PPO | Source: Ambulatory Visit | Attending: Obstetrics and Gynecology | Admitting: Obstetrics and Gynecology

## 2021-08-01 DIAGNOSIS — Z30431 Encounter for routine checking of intrauterine contraceptive device: Secondary | ICD-10-CM | POA: Diagnosis not present

## 2021-08-01 DIAGNOSIS — N8003 Adenomyosis of the uterus: Secondary | ICD-10-CM

## 2021-08-01 DIAGNOSIS — N888 Other specified noninflammatory disorders of cervix uteri: Secondary | ICD-10-CM | POA: Diagnosis not present

## 2021-08-26 DIAGNOSIS — Z0000 Encounter for general adult medical examination without abnormal findings: Secondary | ICD-10-CM | POA: Diagnosis not present

## 2021-09-01 NOTE — Progress Notes (Signed)
GYNECOLOGY  VISIT ?  ?HPI: ?46 y.o.   Married  Caucasian  female   ?[id number] with Patient's last menstrual period was 09/10/2021.   ?here for  IUD follow up check. ? ?States she is doing well with the IUD now.  ?She did have diarrhea for one month after the IUD was placed.  ?First menses was light and used only a panty line.  ?This cycle, she had PMS rage and then just mucousy tinged discharge.  ?States her PMS rage lasts only one day. ?States the bleeding is much improved.  ? ?Starting having some hot flashes. ? ?Still having headaches, the day of her menses and then comes and goes for 2 weeks following this.  ? ?She has a history of menstrual migraines.  ?No aura.  ? ? ?Had pelvic US to confirm the location.  ?Narrative & Impression  ?CLINICAL DATA:  Uncertain IUD position, possible adenomyosis, ?retroverted uterus; LMP 06/30/2021 ?  ?EXAM: ?TRANSABDOMINAL AND TRANSVAGINAL ULTRASOUND OF PELVIS ?  ?TECHNIQUE: ?Both transabdominal and transvaginal ultrasound examinations of the ?pelvis were performed. Transabdominal technique was performed for ?global imaging of the pelvis including uterus, ovaries, adnexal ?regions, and pelvic cul-de-sac. It was necessary to proceed with ?endovaginal exam following the transabdominal exam to visualize the ?endometrium, IUD, and uterus. ?  ?COMPARISON:  07/29/2021 ?  ?FINDINGS: ?Uterus ?  ?Measurements: 10.1 x 5.8 x 6.0 cm = volume: 185 mL. Retroflexed. ?Heterogeneous myometrium which appears somewhat thickened diffusely. ?Few scattered areas of shadowing. Possibility of adenomyosis is ?raised. Nabothian cysts at cervix. No discrete mass. ?  ?Endometrium ?  ?Thickness: 3 mm. IUD in expected position at upper uterine segment ?endometrial canal. No endometrial fluid or mass ?  ?Right ovary ?  ?Measurements: 3.0 x 1.8 x 2.4 cm = volume: 7 mL. Normal morphology ?without mass ?  ?Left ovary ?  ?Measurements: 2.4 x 1.6 x 2.6 cm = volume: 5 mL. Normal morphology ?without mass ?  ?Other  findings ?  ?No free pelvic fluid.  None axial masses. ?  ?IMPRESSION: ?IUD in expected position at upper uterine segment endometrial canal. ?  ?Heterogeneous myometrium with scattered areas of shadowing and ?diffuse thickening of the myometrium at the upper uterus, cannot ?exclude adenomyosis. ?  ?Remainder of exam unremarkable. ?  ?  ?Electronically Signed ?  By: Ulyses Southward M.D. ?  On: 08/01/2021 15:45 ?   ? ? ?GYNECOLOGIC HISTORY: ?Patient's last menstrual period was 09/10/2021. ?Contraception:  IUD Inserted 07-28-21 ?Menopausal hormone therapy:  N/A ?Last mammogram:  03-2020 Normal per pt ?Last pap smear:   2021 normal per pt ?       ?OB History   ? ? Gravida  ?5  ? Para  ?2  ? Term  ?2  ? Preterm  ?0  ? AB  ?3  ? Living  ?2  ?  ? ? SAB  ?0  ? IAB  ?0  ? Ectopic  ?3  ? Multiple  ?0  ? Live Births  ?1  ?   ?  ?  ?    ? ?Patient Active Problem List  ? Diagnosis Date Noted  ? Migraine 08/27/2020  ? Family history of breast cancer 10/08/2015  ? Chronic migraine without aura 11/21/2013  ? SVD (spontaneous vaginal delivery) 04/19/2012  ? Flu vaccine need 02/25/2012  ? Elevated glucose tolerance test 01/25/2012  ? Infertility, female 10/13/2011  ? History of ectopic pregnancy 09/17/2011  ? Hypothyroidism 09/17/2011  ? H/O: depression 09/17/2011  ?  Smoker 09/17/2011  ? ? ?Past Medical History:  ?Diagnosis Date  ? Acute mastitis of left breast 05/24/2008  ? Allergy   ? Anxiety   ? Bladder infection   ? Chronic headaches   ? since age 46  ? Constipation 11/24/2006  ? 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 Depression   ? Ectopic pregnancy 11/26/2006  ? H/O candidiasis   ? H/O urinary frequency 09/13/2007  ? History of bulimia   ? History of shingles   ? 17 yrs. old ?  ? Hx of migraines   ? Hx of migraines   ? with aura  ? Hypothyroidism   ? H/O  ? Infertility, female 12/25/2009  ? Nausea 11/24/2006  ? Spotting in first trimester 11/22/2006  ?  @ 4-5 weeks  ? ? ?Past Surgical History:  ?Procedure Laterality Date  ? ECTOPIC PREGNANCY  SURGERY    ? MANDIBLE FRACTURE SURGERY    ? SALPINGECTOMY    ? right  ? ? ?Current Outpatient Medications  ?Medication Sig Dispense Refill  ? acetaminophen (TYLENOL) 500 MG tablet Take 1,000 mg by mouth every 6 (six) hours as needed. Reported on 05/03/2015    ? diclofenac Sodium (VOLTAREN) 1 % GEL Apply 4 g topically 4 (four) times daily as needed. 500 g 6  ? eletriptan (RELPAX) 40 MG tablet Take 40 mg by mouth as needed for migraine or headache. May repeat in 2 hours if headache persists or recurs.    ? methocarbamol (ROBAXIN) 750 MG tablet Take by mouth.    ? ondansetron (ZOFRAN-ODT) 8 MG disintegrating tablet ondansetron 8 mg disintegrating tablet    ? TYRVAYA 0.03 MG/ACT SOLN Place into both nostrils.    ? valACYclovir (VALTREX) 500 MG tablet Take by mouth.    ? zonisamide (ZONEGRAN) 100 MG capsule Take 100 mg by mouth at bedtime.    ? ALPRAZolam (XANAX) 0.25 MG tablet Take 1 tablet (0.25 mg total) by mouth 3 (three) times daily as needed for anxiety. (Patient not taking: Reported on 09/12/2021) 5 tablet 0  ? ?No current facility-administered medications for this visit.  ?  ? ?ALLERGIES: Ibuprofen, Tape, Wound dressing adhesive, Rizatriptan, and Sumatriptan ? ?Family History  ?Problem Relation Age of Onset  ? Cancer Mother   ?     breast  ? Cancer Father   ?     prostate  ? Alcohol abuse Father   ? Hyperlipidemia Father   ? Mental illness Brother   ?     ADHD  ? COPD Maternal Grandmother   ?     emphysema  ? Cancer Maternal Grandmother   ?     thyroid and colon  ? Mental illness Maternal Grandfather   ? Diabetes Paternal Grandmother   ? Heart disease Paternal Grandmother   ? Hypertension Paternal Grandmother   ? Hyperlipidemia Paternal Grandmother   ? Cancer Paternal Grandfather   ?     bladder  ? ? ?Social History  ? ?Socioeconomic History  ? Marital status: Married  ?  Spouse name: Not on file  ? Number of children: Not on file  ? Years of education: Not on file  ? Highest education level: Not on file   ?Occupational History  ? Not on file  ?Tobacco Use  ? Smoking status: Former  ?  Types: Cigarettes  ? Smokeless tobacco: Never  ? Tobacco comments:  ?  Quit age 12  ?Vaping Use  ? Vaping Use: Never used  ?Substance  and Sexual Activity  ? Alcohol use: No  ?  Alcohol/week: 0.0 standard drinks  ? Drug use: No  ? Sexual activity: Yes  ?  Birth control/protection: Other-see comments  ?  Comment: withdrawal  ?Other Topics Concern  ? Not on file  ?Social History Narrative  ? Not on file  ? ?Social Determinants of Health  ? ?Financial Resource Strain: Not on file  ?Food Insecurity: Not on file  ?Transportation Needs: Not on file  ?Physical Activity: Not on file  ?Stress: Not on file  ?Social Connections: Not on file  ?Intimate Partner Violence: Not on file  ? ? ?Review of Systems  See HPI. ? ?PHYSICAL EXAMINATION:   ? ?BP 116/78   LMP 09/10/2021     ?General appearance: alert, cooperative and appears stated age ? ?Pelvic: External genitalia:  no lesions ?             Urethra:  normal appearing urethra with no masses, tenderness or lesions ?             Bartholins and Skenes: normal    ?             Vagina: normal appearing vagina with normal color and discharge, no lesions ?             Cervix: no lesions.  IUD strings noted.  ?               ?Bimanual Exam:  Uterus:  normal size, contour, position, consistency, mobility, non-tender ?             Adnexa: no mass, fullness, tenderness ?   ?Chaperone was present for exam:  Selena BattenKim, CMA ? ?ASSESSMENT ? ?Status post Mirena IUD.  ?Possible adenomyosis.  ?Migraine headache during and after her cycle.  ? ?PLAN ? ?Reassurance regarding IUD position.  ?Adenomyosis discussed.  ?Update mammogram.  ?If mammogram normal, then start Vivelle Dot 0.05 mg twice weekly during her menstruation.  ?FU for annual exam and prn.  ?  ?An After Visit Summary was printed and given to the patient. ? ?26 min  total time was spent for this patient encounter, including preparation, face-to-face counseling  with the patient, coordination of care, and documentation of the encounter. ? ?  ?  ?

## 2021-09-09 DIAGNOSIS — H16223 Keratoconjunctivitis sicca, not specified as Sjogren's, bilateral: Secondary | ICD-10-CM | POA: Diagnosis not present

## 2021-09-09 DIAGNOSIS — H0288A Meibomian gland dysfunction right eye, upper and lower eyelids: Secondary | ICD-10-CM | POA: Diagnosis not present

## 2021-09-09 DIAGNOSIS — H16142 Punctate keratitis, left eye: Secondary | ICD-10-CM | POA: Diagnosis not present

## 2021-09-09 DIAGNOSIS — H0288B Meibomian gland dysfunction left eye, upper and lower eyelids: Secondary | ICD-10-CM | POA: Diagnosis not present

## 2021-09-12 ENCOUNTER — Encounter: Payer: Self-pay | Admitting: Obstetrics and Gynecology

## 2021-09-12 ENCOUNTER — Other Ambulatory Visit: Payer: Self-pay | Admitting: Obstetrics and Gynecology

## 2021-09-12 ENCOUNTER — Ambulatory Visit: Payer: BC Managed Care – PPO | Admitting: Obstetrics and Gynecology

## 2021-09-12 VITALS — BP 116/78

## 2021-09-12 DIAGNOSIS — G43829 Menstrual migraine, not intractable, without status migrainosus: Secondary | ICD-10-CM

## 2021-09-12 DIAGNOSIS — Z30431 Encounter for routine checking of intrauterine contraceptive device: Secondary | ICD-10-CM | POA: Diagnosis not present

## 2021-09-12 DIAGNOSIS — Z1231 Encounter for screening mammogram for malignant neoplasm of breast: Secondary | ICD-10-CM

## 2021-09-15 ENCOUNTER — Ambulatory Visit: Payer: BC Managed Care – PPO

## 2021-09-24 DIAGNOSIS — R41840 Attention and concentration deficit: Secondary | ICD-10-CM | POA: Diagnosis not present

## 2021-09-24 DIAGNOSIS — F419 Anxiety disorder, unspecified: Secondary | ICD-10-CM | POA: Diagnosis not present

## 2021-09-24 NOTE — Telephone Encounter (Signed)
Opened in [place]

## 2021-10-02 DIAGNOSIS — Z3189 Encounter for other procreative management: Secondary | ICD-10-CM | POA: Diagnosis not present

## 2021-10-08 ENCOUNTER — Ambulatory Visit: Payer: BC Managed Care – PPO

## 2021-10-21 ENCOUNTER — Ambulatory Visit: Payer: BC Managed Care – PPO

## 2021-10-23 DIAGNOSIS — F419 Anxiety disorder, unspecified: Secondary | ICD-10-CM | POA: Diagnosis not present

## 2021-10-23 DIAGNOSIS — Z79899 Other long term (current) drug therapy: Secondary | ICD-10-CM | POA: Diagnosis not present

## 2021-10-23 DIAGNOSIS — F902 Attention-deficit hyperactivity disorder, combined type: Secondary | ICD-10-CM | POA: Diagnosis not present

## 2021-11-28 DIAGNOSIS — G43109 Migraine with aura, not intractable, without status migrainosus: Secondary | ICD-10-CM | POA: Diagnosis not present

## 2022-01-22 DIAGNOSIS — Z79899 Other long term (current) drug therapy: Secondary | ICD-10-CM | POA: Diagnosis not present

## 2022-01-22 DIAGNOSIS — F902 Attention-deficit hyperactivity disorder, combined type: Secondary | ICD-10-CM | POA: Diagnosis not present

## 2022-04-22 DIAGNOSIS — G43009 Migraine without aura, not intractable, without status migrainosus: Secondary | ICD-10-CM | POA: Diagnosis not present

## 2022-06-08 ENCOUNTER — Ambulatory Visit (INDEPENDENT_AMBULATORY_CARE_PROVIDER_SITE_OTHER): Payer: No Typology Code available for payment source | Admitting: Neurology

## 2022-06-08 ENCOUNTER — Encounter: Payer: Self-pay | Admitting: Neurology

## 2022-06-08 VITALS — BP 115/78 | HR 91 | Ht 63.5 in | Wt 168.2 lb

## 2022-06-08 DIAGNOSIS — G43109 Migraine with aura, not intractable, without status migrainosus: Secondary | ICD-10-CM | POA: Diagnosis not present

## 2022-06-08 DIAGNOSIS — G43009 Migraine without aura, not intractable, without status migrainosus: Secondary | ICD-10-CM

## 2022-06-08 MED ORDER — QULIPTA 60 MG PO TABS: 60.0000 mg | ORAL_TABLET | Freq: Every day | ORAL | 0 refills | Status: DC

## 2022-06-08 MED ORDER — QULIPTA 60 MG PO TABS: 60.0000 mg | ORAL_TABLET | Freq: Every day | ORAL | 11 refills | Status: DC

## 2022-06-08 NOTE — Progress Notes (Signed)
[facility]    Provider:  Dr Lucia Gaskins Requesting Provider: Jarrett Soho, PA-C Primary Care Provider:  Jarrett Soho, PA-C  CC:  Migraine  HPI:  Sherry Johnston is a 47 y.o. female here as requested by Jarrett Soho, PA-C for migraines. Transitioning from [facility]. Migraines since the age of 59, she has tried everything, she has been to [facility], [facility], she has tried and failed multiple medications, tried calendars and food diaries. Botox helped. Relpax is the only thing that has ever helped stop the headache consistently. Worsening on Tgiving, On average she has having 5-8 migraines a month and < 10 total headache days a month. She has even tried botox with the the injectibles that helped. Weather is a trigger. She has visual static but aura rarely. No medication oberuse. Her migraines are pounding/pulsating.throbbing, light and sound sesitivity, nausea, moving makes it hurt, weather is huge trigger, hormones, discussed migraine with aura and contraindication for estrogen but if very hormonal and understands the risks then keeping her estrogen stable can help migraines, could try to stop periods for 3 months the IUD di dnot stop it has helped with the hormonal fluctuations. Sensitive to sugar. She has had thorough evaluation with courtney wharton. She has had MRIs of the brain twice last completed with [facility]. The migraines can be in the back of the left head, or behind the left eye/forehead points to the supratrochlear nerve and can spread to the top of the head and into the jaw, she gets trigger point massage for cervical myofascial pain and masseters hypertrophy and tightness, very tight muscles. Has had vomiting, has had to go to the ED for toradol. No other focal neurologic deficits, associated symptoms, inciting events or modifiable factors.  Reviewed notes, labs and imaging from outside physicians, which  showed:  ANALGESICS: BC, Goody's, Codeine, Esig, Excedrin, Fioricet, Lorcet, Percocet, Tylenol, Vicodin ANTI-MIGRAINE: DHE inj, Imitrex in, Maxalt, Relpax, Zomig nasal, Nurtec every other day-ineffective, Ubrelvy- ineffective, Reyvow-side effects HEART/BP: Inderal, Verapamil DECONGESTANT/ANTIHISTAMINE: Allegra, Benadryl, Flonase, Sudafed ANTI-NAUSEANT:zofran NSAIDS: Advil, Aleve, Celebrex MUSCLE RELAXANTS: Baclofen, Flexeril ANTI-CONVULSANTS: Depakote, Klonopin, Topamax, Zonegran-very effective STEROIDS: Prednisone SLEEPING PILLS/TRANQUILIZERS: Tylenol PM ANTI-DEPRESSANTS: Effexor, Amitriptyline HERBAL: Coenzyme, Magnesium FIBROMYALGIA:  HORMONAL: Yasmine, progesterone OTHER: Aimovig, Emgality, Ajovy-unable to get approved PROCEDURES FOR HEADACHES: Botox   Review of Systems: Patient complains of symptoms per HPI as well as the following symptoms migraines. Pertinent negatives and positives per HPI. All others negative.   Social History   Socioeconomic History   Marital status: Married    Spouse name: Not on file   Number of children: Not on file   Years of education: Not on file   Highest education level: Not on file  Occupational History   Not on file  Tobacco Use   Smoking status: Former    Types: Cigarettes   Smokeless tobacco: Never   Tobacco comments:    Quit age 65  Vaping Use   Vaping Use: Never used  Substance and Sexual Activity   Alcohol use: No    Alcohol/week: 0.0 standard drinks of alcohol   Drug use: No   Sexual activity: Yes    Birth control/protection: Other-see comments    Comment: withdrawal  Other Topics Concern   Not on file  Social History Narrative   Caffiene; 1 cup daily coffee, 1/2 caff in pm.     Real coke.   Social Determinants of Health   Financial Resource Strain: Not on file  Food Insecurity: Not on  file  Transportation Needs: Not on file  Physical Activity: Not on file  Stress: Not on file  Social Connections: Not on file   Intimate Partner Violence: Not on file    Family History  Problem Relation Age of Onset   Migraines Mother    Cancer Mother        breast   Migraines Father    Cancer Father        prostate   Alcohol abuse Father    Hyperlipidemia Father    Mental illness Brother        ADHD   COPD Maternal Grandmother        emphysema   Cancer Maternal Grandmother        thyroid and colon   Mental illness Maternal Grandfather    Diabetes Paternal Grandmother    Heart disease Paternal Grandmother    Hypertension Paternal Grandmother    Hyperlipidemia Paternal Grandmother    Cancer Paternal Grandfather        bladder    Past Medical History:  Diagnosis Date   Acute mastitis of left breast 05/24/2008   Allergy    Anxiety    Bladder infection    Chronic headaches    since age 52   Constipation 11/24/2006   Dengue fever    2002   Depression    Ectopic pregnancy 11/26/2006   H/O candidiasis    H/O urinary frequency 09/13/2007   History of bulimia    History of shingles    17 yrs. old ?   Hx of migraines    Hx of migraines    with aura   Hypothyroidism    H/O   Infertility, female 12/25/2009   Nausea 11/24/2006   Spotting in first trimester 11/22/2006    @ 4-5 weeks    Patient Active Problem List   Diagnosis Date Noted   Migraine 08/27/2020   Family history of breast cancer 10/08/2015   Chronic migraine without aura 11/21/2013   SVD (spontaneous vaginal delivery) 04/19/2012   Flu vaccine need 02/25/2012   Elevated glucose tolerance test 01/25/2012   Infertility, female 10/13/2011   History of ectopic pregnancy 09/17/2011   Hypothyroidism 09/17/2011   H/O: depression 09/17/2011   Smoker 09/17/2011    Past Surgical History:  Procedure Laterality Date   ECTOPIC PREGNANCY SURGERY     x 2   MANDIBLE FRACTURE SURGERY Bilateral    SALPINGECTOMY     right    Current Outpatient Medications  Medication Sig Dispense Refill   Atogepant (QULIPTA) 60 MG TABS Take 1  tablet (60 mg total) by mouth daily. 16 tablet 0   Atogepant (QULIPTA) 60 MG TABS Take 1 tablet (60 mg total) by mouth daily. 30 tablet 11   diclofenac Sodium (VOLTAREN) 1 % GEL Apply 4 g topically 4 (four) times daily as needed. 500 g 6   eletriptan (RELPAX) 40 MG tablet Take 40 mg by mouth as needed for migraine or headache. May repeat in 2 hours if headache persists or recurs.     FLUoxetine (PROZAC) 40 MG capsule Take 40 mg by mouth daily.     ondansetron (ZOFRAN-ODT) 8 MG disintegrating tablet ondansetron 8 mg disintegrating tablet     TYRVAYA 0.03 MG/ACT SOLN Place into both nostrils.     VYVANSE 40 MG CHEW Chew 1 tablet by mouth daily.     zonisamide (ZONEGRAN) 100 MG capsule Take 100 mg by mouth at bedtime.     No current facility-administered medications  for this visit.    Allergies as of 06/08/2022 - Review Complete 06/08/2022  Allergen Reaction Noted   Ibuprofen Nausea And Vomiting and Other (See Comments) 11/21/2013   Tape Dermatitis 04/19/2012   Wound dressing adhesive Rash 10/25/2020   Rizatriptan Other (See Comments) 05/27/2021   Sumatriptan Other (See Comments) 06/23/2005    Vitals: BP 115/78   Pulse 91   Ht 5' 3.5" (1.613 m)   Wt 168 lb 3.2 oz (76.3 kg)   BMI 29.33 kg/m  Last Weight:  Wt Readings from Last 1 Encounters:  06/08/22 168 lb 3.2 oz (76.3 kg)   Last Height:   Ht Readings from Last 1 Encounters:  06/08/22 5' 3.5" (1.613 m)     Physical exam: Exam: Gen: NAD, conversant, well nourised, obese, well groomed                     CV: RRR, no MRG. No Carotid Bruits. No peripheral edema, warm, nontender Eyes: Conjunctivae clear without exudates or hemorrhage  Neuro: Detailed Neurologic Exam  Speech:    Speech is normal; fluent and spontaneous with normal comprehension.  Cognition:    The patient is oriented to person, place, and time;     recent and remote memory intact;     language fluent;     normal attention, concentration,     fund of  knowledge Cranial Nerves:    The pupils are equal, round, and reactive to light. The fundi are normal and spontaneous venous pulsations are present. Visual fields are full to finger confrontation. Extraocular movements are intact. Trigeminal sensation is intact and the muscles of mastication are normal. The face is symmetric. The palate elevates in the midline. Hearing intact. Voice is normal. Shoulder shrug is normal. The tongue has normal motion without fasciculations.   Coordination:    Normal finger to nose and heel to shin. Normal rapid alternating movements.   Gait:    Heel-toe and tandem gait are normal.   Motor Observation:    No asymmetry, no atrophy, and no involuntary movements noted. Tone:    Normal muscle tone.    Posture:    Posture is normal. normal erect    Strength:    Strength is V/V in the upper and lower limbs.      Sensation: intact to LT     Reflex Exam:  DTR's:    Deep tendon reflexes in the upper and lower extremities are normal bilaterally.   Toes:    The toes are downgoing bilaterally.   Clonus:    Clonus is absent.    Assessment/Plan:  patient with an extremely long history of migraines tried multiple medications including injections and botox. Relpax helps acutely. Toradol injections help as well.  Would try Qulipta and see how that goes. After that would add on possibly botox of orther if needed. Try Lenoria Chime: [facility] [place], [place] - [address], [address] [phone]  Continue Relpax Can consider Toradol injections at home as secondary for intractable migaines so she doesn't have to go to the ED After that would layer on other options such as botox (she is big clencher, may consider that as well) F/u 8 weeks Another medication called Vyepti as well Discussed non pharmaceutical tools (see AVS)  Meds ordered this encounter  Medications   Atogepant (QULIPTA) 60 MG TABS    Sig: Take 1 tablet (60 mg total) by mouth daily.     Dispense:  16 tablet  Refill:  0   Atogepant (QULIPTA) 60 MG TABS    Sig: Take 1 tablet (60 mg total) by mouth daily.    Dispense:  30 tablet    Refill:  11    5-7 migraine days a month and < 10 total headache days a month.Tried topamax,amitriptyline,effexor,depakote,propranolol,verapamil,imitrex,relpax,zomig,nurtec,ubrelvy,aimovig,emgality    Cc: Marda Stalker, PA-C,  Marda Stalker, [place]  Sarina Ill, MD  [facility] [address] [place], [place] [address]  Phone [phone] Fax [phone]  I spent over 60 minutes of face-to-face and non-face-to-face time with patient on the  1. Migraine with aura and without status migrainosus, not intractable   2. Migraine without aura and without status migrainosus, not intractable    diagnosis.  This included previsit chart review, lab review, study review, order entry, electronic health record documentation, patient education on the different diagnostic and therapeutic options, counseling and coordination of care, risks and benefits of management, compliance, or risk factor reduction

## 2022-06-08 NOTE — Patient Instructions (Addendum)
Try Sherry Johnston: [facility] [place], [place] - [address], [address] [phone]  Continue Relpax Can consider Toradol injections at home as secondary for intractable migaines so she doesn't have to go to the ED After that would layer on other options such as botox (she is big clencher, may consider that as well) F/u 8 weeks Another medication called Vyepti  There is increased risk for stroke in women with migraine with aura and a contraindication for the combined contraceptive pill for use by women who have migraine with aura. The risk for women with migraine without aura is lower. However other risk factors like smoking are far more likely to increase stroke risk than migraine. There is a recommendation for no smoking and for the use of OCPs without estrogen such as progestogen only pills particularly for women with migraine with aura.Marland Kitchen People who have migraine headaches with auras may be 3 times more likely to have a stroke caused by a blood clot, compared to migraine patients who don't see auras. Women who take hormone-replacement therapy may be 30 percent more likely to suffer a clot-based stroke than women not taking medication containing estrogen. Other risk factors like smoking and high blood pressure may be  much more important.  Atogepant Tablets What is this medication? ATOGEPANT (a TOE je pant) prevents migraines. It works by blocking a substance in the body that causes migraines. This medicine may be used for other purposes; ask your health care provider or pharmacist if you have questions. COMMON BRAND NAME(S): QULIPTA What should I tell my care team before I take this medication? They need to know if you have any of these conditions: Kidney disease Liver disease An unusual or allergic reaction to atogepant, other medications, foods, dyes, or preservatives Pregnant or trying to get pregnant Breast-feeding How should I use this medication? Take this medication by  mouth with water. Take it as directed on the prescription label at the same time every day. You can take it with or without food. If it upsets your stomach, take it with food. Keep taking it unless your care team tells you to stop. Talk to your care team about the use of this medication in children. Special care may be needed. Overdosage: If you think you have taken too much of this medicine contact a poison control center or emergency room at once. NOTE: This medicine is only for you. Do not share this medicine with others. What if I miss a dose? If you miss a dose, take it as soon as you can. If it is almost time for your next dose, take only that dose. Do not take double or extra doses. What may interact with this medication? Carbamazepine Certain medications for fungal infections, such as itraconazole, ketoconazole Clarithromycin Cyclosporine Efavirenz Etravirine Phenytoin Rifampin St. John's wort This list may not describe all possible interactions. Give your health care provider a list of all the medicines, herbs, non-prescription drugs, or dietary supplements you use. Also tell them if you smoke, drink alcohol, or use illegal drugs. Some items may interact with your medicine. What should I watch for while using this medication? Visit your care team for regular checks on your progress. Tell your care team if your symptoms do not start to get better or if they get worse. What side effects may I notice from receiving this medication? Side effects that you should report to your care team as soon as possible: Allergic reactions--skin rash, itching, hives, swelling of the face,  lips, tongue, or throat Side effects that usually do not require medical attention (report to your care team if they continue or are bothersome): Constipation Fatigue Loss of appetite with weight loss Nausea This list may not describe all possible side effects. Call your doctor for medical advice about side  effects. You may report side effects to FDA at 1-800-FDA-1088. Where should I keep my medication? Keep out of the reach of children and pets. Store at room temperature between 20 and 25 degrees C (68 and 77 degrees F). Get rid of any unused medication after the expiration date. To get rid of medications that are no longer needed or have expired: Take the medication to a medication take-back program. Check with your pharmacy or law enforcement to find a location. If you cannot return the medication, check the label or package insert to see if the medication should be thrown out in the garbage or flushed down the toilet. If you are not sure, ask your care team. If it is safe to put it in the trash, take the medication out of the container. Mix the medication with cat litter, dirt, coffee grounds, or other unwanted substance. Seal the mixture in a bag or container. Put it in the trash. NOTE: This sheet is a summary. It may not cover all possible information. If you have questions about this medicine, talk to your doctor, pharmacist, or health care provider.  2023 Elsevier/Gold Standard (2020-02-05 00:00:00)   Non pharmaceutical treatments for migraines  Cefaly    Nerivio     gammaCore SapphireTM is the first and only FDA cleared non-invasive device to treat and prevent multiple types of headache pain via the vagus nerve. It's small, handheld and portable for quick and easy treatments whenever and wherever needed.     E-Neura - https://fuller.com/     Fascial release tools

## 2022-08-10 ENCOUNTER — Telehealth (INDEPENDENT_AMBULATORY_CARE_PROVIDER_SITE_OTHER): Payer: No Typology Code available for payment source | Admitting: Neurology

## 2022-08-10 ENCOUNTER — Telehealth: Payer: Self-pay | Admitting: Neurology

## 2022-08-10 ENCOUNTER — Encounter: Payer: Self-pay | Admitting: Neurology

## 2022-08-10 DIAGNOSIS — G43009 Migraine without aura, not intractable, without status migrainosus: Secondary | ICD-10-CM

## 2022-08-10 MED ORDER — ZONISAMIDE 25 MG PO CAPS: 75.0000 mg | ORAL_CAPSULE | Freq: Every day | ORAL | 11 refills | Status: DC

## 2022-08-10 NOTE — Progress Notes (Signed)
[facility]    Provider:  Dr Lucia Gaskins Requesting Provider: Jarrett Soho, PA-C Primary Care Provider:  Jarrett Soho, PA-C  CC:  Migraine  Virtual Visit via Video Note  I connected with Trellis Paganini on 08/10/22 at  1:00 PM EDT by a video enabled telemedicine application and verified that I am speaking with the correct person using two identifiers.  Location: Patient: home Provider: office   I discussed the limitations of evaluation and management by telemedicine and the availability of in person appointments. The patient expressed understanding and agreed to proceed.   Follow Up Instructions:    I discussed the assessment and treatment plan with the patient. The patient was provided an opportunity to ask questions and all were answered. The patient agreed with the plan and demonstrated an understanding of the instructions.   The patient was advised to call back or seek an in-person evaluation if the symptoms worsen or if the condition fails to improve as anticipated.  I provided over 20 minutes of non-face-to-face time during this encounter.   Anson Fret, MD   08/10/2022: She is doing great. She has only taken one Relpax since laving my office in January. Doing Fantastic! Continue Qulipta. Relpax worked Firefighter.  She has constipation, mag citrate helps but mag oxide causes a migraine. She is thrilled. She has a little heartburn. Famotidine for a little bit helped. Otherwise everything is great. She has jaw tightness and also she is on zonisamide. Could decrease to 50 and if the migraines get worse go right back up. Can decrease zonisamide to 75 then 50 then 25 and if the migraines come back go back to latest dose.   Patient complains of symptoms per HPI as well as the following symptoms: none . Pertinent negatives and positives per HPI. All others negative   HPI:  Sherry Johnston is a 47 y.o. female here as requested by Jarrett Soho, PA-C  for migraines. Transitioning from [facility]. Migraines since the age of 51, she has tried everything, she has been to [facility], [facility], she has tried and failed multiple medications, tried calendars and food diaries. Botox helped. Relpax is the only thing that has ever helped stop the headache consistently. Worsening on Tgiving, On average she has having 5-8 migraines a month and < 10 total headache days a month. She has even tried botox with the the injectibles that helped. Weather is a trigger. She has visual static but aura rarely. No medication oberuse. Her migraines are pounding/pulsating.throbbing, light and sound sesitivity, nausea, moving makes it hurt, weather is huge trigger, hormones, discussed migraine with aura and contraindication for estrogen but if very hormonal and understands the risks then keeping her estrogen stable can help migraines, could try to stop periods for 3 months the IUD di dnot stop it has helped with the hormonal fluctuations. Sensitive to sugar. She has had thorough evaluation with courtney wharton. She has had MRIs of the brain twice last completed with [facility]. The migraines can be in the back of the left head, or behind the left eye/forehead points to the supratrochlear nerve and can spread to the top of the head and into the jaw, she gets trigger point massage for cervical myofascial pain and masseters hypertrophy and tightness, very tight muscles. Has had vomiting, has had to go to the ED for toradol. No other focal neurologic deficits, associated symptoms, inciting events or modifiable factors.  Reviewed notes, labs and imaging from outside physicians, which showed:  ANALGESICS: BC, Goody's, Codeine, Esig, Excedrin, Fioricet, Lorcet, Percocet, Tylenol, Vicodin ANTI-MIGRAINE: DHE inj, Imitrex in, Maxalt, Relpax, Zomig nasal, Nurtec every other day-ineffective, Ubrelvy- ineffective, Reyvow-side effects HEART/BP: Inderal,  Verapamil DECONGESTANT/ANTIHISTAMINE: Allegra, Benadryl, Flonase, Sudafed ANTI-NAUSEANT:zofran NSAIDS: Advil, Aleve, Celebrex MUSCLE RELAXANTS: Baclofen, Flexeril ANTI-CONVULSANTS: Depakote, Klonopin, Topamax, Zonegran-very effective STEROIDS: Prednisone SLEEPING PILLS/TRANQUILIZERS: Tylenol PM ANTI-DEPRESSANTS: Effexor, Amitriptyline HERBAL: Coenzyme, Magnesium FIBROMYALGIA:  HORMONAL: Yasmine, progesterone OTHER: Aimovig, Emgality, Ajovy-unable to get approved PROCEDURES FOR HEADACHES: Botox   Review of Systems: Patient complains of symptoms per HPI as well as the following symptoms migraines. Pertinent negatives and positives per HPI. All others negative.   Social History   Socioeconomic History   Marital status: Married    Spouse name: Not on file   Number of children: Not on file   Years of education: Not on file   Highest education level: Not on file  Occupational History   Not on file  Tobacco Use   Smoking status: Former    Types: Cigarettes   Smokeless tobacco: Never   Tobacco comments:    Quit age 47  Vaping Use   Vaping Use: Never used  Substance and Sexual Activity   Alcohol use: No    Alcohol/week: 0.0 standard drinks of alcohol   Drug use: No   Sexual activity: Yes    Birth control/protection: Other-see comments    Comment: withdrawal  Other Topics Concern   Not on file  Social History Narrative   Caffiene; 1 cup daily coffee, 1/2 caff in pm.     Real coke.   Social Determinants of Health   Financial Resource Strain: Not on file  Food Insecurity: Not on file  Transportation Needs: Not on file  Physical Activity: Not on file  Stress: Not on file  Social Connections: Not on file  Intimate Partner Violence: Not on file    Family History  Problem Relation Age of Onset   Migraines Mother    Cancer Mother        breast   Migraines Father    Cancer Father        prostate   Alcohol abuse Father    Hyperlipidemia Father    Mental illness  Brother        ADHD   COPD Maternal Grandmother        emphysema   Cancer Maternal Grandmother        thyroid and colon   Mental illness Maternal Grandfather    Diabetes Paternal Grandmother    Heart disease Paternal Grandmother    Hypertension Paternal Grandmother    Hyperlipidemia Paternal Grandmother    Cancer Paternal Grandfather        bladder    Past Medical History:  Diagnosis Date   Acute mastitis of left breast 05/24/2008   Allergy    Anxiety    Bladder infection    Chronic headaches    since age 89   Constipation 11/24/2006   Dengue fever    2002   Depression    Ectopic pregnancy 11/26/2006   H/O candidiasis    H/O urinary frequency 09/13/2007   History of bulimia    History of shingles    17 yrs. old ?   Hx of migraines    Hx of migraines    with aura   Hypothyroidism    H/O   Infertility, female 12/25/2009   Nausea 11/24/2006   Spotting in first trimester 11/22/2006    @ 4-5 weeks    Patient  Active Problem List   Diagnosis Date Noted   Migraine 08/27/2020   Family history of breast cancer 10/08/2015   Chronic migraine without aura 11/21/2013   SVD (spontaneous vaginal delivery) 04/19/2012   Flu vaccine need 02/25/2012   Elevated glucose tolerance test 01/25/2012   Infertility, female 10/13/2011   History of ectopic pregnancy 09/17/2011   Hypothyroidism 09/17/2011   H/O: depression 09/17/2011   Smoker 09/17/2011    Past Surgical History:  Procedure Laterality Date   ECTOPIC PREGNANCY SURGERY     x 2   MANDIBLE FRACTURE SURGERY Bilateral    SALPINGECTOMY     right    Current Outpatient Medications  Medication Sig Dispense Refill   zonisamide (ZONEGRAN) 25 MG capsule Take 3 capsules (75 mg total) by mouth daily. 90 capsule 11   Atogepant (QULIPTA) 60 MG TABS Take 1 tablet (60 mg total) by mouth daily. 16 tablet 0   Atogepant (QULIPTA) 60 MG TABS Take 1 tablet (60 mg total) by mouth daily. 30 tablet 11   diclofenac Sodium (VOLTAREN) 1 %  GEL Apply 4 g topically 4 (four) times daily as needed. 500 g 6   eletriptan (RELPAX) 40 MG tablet Take 40 mg by mouth as needed for migraine or headache. May repeat in 2 hours if headache persists or recurs.     FLUoxetine (PROZAC) 40 MG capsule Take 40 mg by mouth daily.     ondansetron (ZOFRAN-ODT) 8 MG disintegrating tablet ondansetron 8 mg disintegrating tablet     TYRVAYA 0.03 MG/ACT SOLN Place into both nostrils.     VYVANSE 40 MG CHEW Chew 1 tablet by mouth daily.     No current facility-administered medications for this visit.    Allergies as of 08/10/2022 - Review Complete 08/10/2022  Allergen Reaction Noted   Ibuprofen Nausea And Vomiting and Other (See Comments) 11/21/2013   Tape Dermatitis 04/19/2012   Wound dressing adhesive Rash 10/25/2020   Rizatriptan Other (See Comments) 05/27/2021   Sumatriptan Other (See Comments) 06/23/2005    Vitals: There were no vitals taken for this visit. Last Weight:  Wt Readings from Last 1 Encounters:  06/08/22 168 lb 3.2 oz (76.3 kg)   Last Height:   Ht Readings from Last 1 Encounters:  06/08/22 5' 3.5" (1.613 m)     Physical exam: Exam: Gen: NAD, conversant      CV: Denies palpitations or chest pain or SOB. VS: Breathing at a normal rate. Weight appears within normal limits. Not febrile. Eyes: Conjunctivae clear without exudates or hemorrhage  Neuro: Detailed Neurologic Exam  Speech:    Speech is normal; fluent and spontaneous with normal comprehension.  Cognition:    The patient is oriented to person, place, and time;     recent and remote memory intact;     language fluent;     normal attention, concentration,     fund of knowledge Cranial Nerves:    The pupils are equal, round, and reactive to light. Cannot perform fundoscopic exam. Visual fields are full to finger confrontation. Extraocular movements are intact.  The face is symmetric with normal sensation. The palate elevates in the midline. Hearing intact. Voice  is normal. Shoulder shrug is normal. The tongue has normal motion without fasciculations.   Coordination:    Normal finger to nose  Gait:    Normal native gait  Motor Observation:   no involuntary movements noted. Tone:    Appears normal  Posture:    Posture is normal. normal erect  Strength:    Strength is anti-gravity and symmetric in the upper and lower limbs.      Sensation: intact to LT        Assessment/Plan:  patient with an extremely long history of migraines tried multiple medications including injections and botox. Relpax helps acutely. Toradol injections help as well.    08/10/2022: She is doing great. She has only taken one Relpax since laving my office in January. Doing Fantastic! Continue Qulipta. Relpax worked Firefighter.  She has constipation, mag citrate helps but mag oxide causes a migraine. She is thrilled. She has a little heartburn. Famotidine for a little bit helped. Otherwise everything is great. She has jaw tightness and also she is on zonisamide. Could decrease to 50 and if the migraines get worse go right back up. Can decrease zonisamide to 75 then 50 then 25 and if the migraines come back go back to latest dose.   Decrease zonisamide by 25mg  every few weeks if the migraine start again then go back to previous dose.  Continue qulipta and relpax  PRIOR Assessment and plan:  Would try Qulipta and see how that goes. After that would add on possibly botox of orther if needed. Try Bennie Pierini: [facility] - [place], [place] - [address] [phone]  Continue Relpax Can consider Toradol injections at home as secondary for intractable migaines so she doesn't have to go to the ED After that would layer on other options such as botox (she is big clencher, may consider that as well) F/u 8 weeks Another medication called Vyepti as well Discussed non pharmaceutical tools (see AVS)  Meds ordered this encounter  Medications   zonisamide  (ZONEGRAN) 25 MG capsule    Sig: Take 3 capsules (75 mg total) by mouth daily.    Dispense:  90 capsule    Refill:  11    Cc: Jarrett Soho, PA-C,  Jarrett Soho, PA-C  Naomie Dean, MD  [facility] [address] [place], [place] [address]  Phone [phone] Fax [phone]

## 2022-08-10 NOTE — Telephone Encounter (Signed)
Pt scheduled for video visit with Sarah for 04/14/23 at 2:30pm

## 2022-08-10 NOTE — Telephone Encounter (Signed)
Please call her for December follow up video with NP for med refill on migraines thanks

## 2022-08-10 NOTE — Patient Instructions (Addendum)
Decrease zonisamide by 25mg  every few weeks if the migraine start again then go back to previous dose.  Continue qulipta and relpax

## 2022-08-20 ENCOUNTER — Other Ambulatory Visit (HOSPITAL_COMMUNITY): Payer: Self-pay

## 2022-08-20 MED ORDER — QULIPTA 60 MG PO TABS
60.0000 mg | ORAL_TABLET | Freq: Every day | ORAL | 9 refills | Status: DC
  Filled 2022-08-20: qty 30, 30d supply, fill #0
  Filled 2022-09-21: qty 30, 30d supply, fill #1
  Filled 2022-10-17: qty 30, 30d supply, fill #2
  Filled 2022-11-23: qty 30, 30d supply, fill #3
  Filled 2022-12-22 – 2022-12-31 (×2): qty 30, 30d supply, fill #4
  Filled 2023-01-25: qty 30, 30d supply, fill #5
  Filled 2023-03-01: qty 30, 30d supply, fill #6
  Filled 2023-03-28: qty 30, 30d supply, fill #7
  Filled 2023-04-27: qty 30, 30d supply, fill #8
  Filled 2023-06-01: qty 30, 30d supply, fill #9

## 2022-11-23 ENCOUNTER — Other Ambulatory Visit: Payer: Self-pay | Admitting: Neurology

## 2022-11-23 ENCOUNTER — Other Ambulatory Visit (HOSPITAL_COMMUNITY): Payer: Self-pay

## 2022-11-27 ENCOUNTER — Other Ambulatory Visit (HOSPITAL_COMMUNITY): Payer: Self-pay

## 2022-12-30 ENCOUNTER — Other Ambulatory Visit (HOSPITAL_COMMUNITY): Payer: Self-pay

## 2022-12-31 ENCOUNTER — Other Ambulatory Visit (HOSPITAL_COMMUNITY): Payer: Self-pay

## 2023-01-28 ENCOUNTER — Other Ambulatory Visit (HOSPITAL_COMMUNITY): Payer: Self-pay

## 2023-02-22 ENCOUNTER — Emergency Department (HOSPITAL_BASED_OUTPATIENT_CLINIC_OR_DEPARTMENT_OTHER)
Admission: EM | Admit: 2023-02-22 | Discharge: 2023-02-22 | Disposition: A | Discharge status: Home / Self Care | Payer: No Typology Code available for payment source | Attending: Emergency Medicine | Admitting: Emergency Medicine

## 2023-02-22 ENCOUNTER — Other Ambulatory Visit: Payer: Self-pay

## 2023-02-22 ENCOUNTER — Encounter (HOSPITAL_BASED_OUTPATIENT_CLINIC_OR_DEPARTMENT_OTHER): Payer: Self-pay

## 2023-02-22 ENCOUNTER — Other Ambulatory Visit (HOSPITAL_BASED_OUTPATIENT_CLINIC_OR_DEPARTMENT_OTHER): Payer: Self-pay

## 2023-02-22 ENCOUNTER — Ambulatory Visit (HOSPITAL_BASED_OUTPATIENT_CLINIC_OR_DEPARTMENT_OTHER)
Admission: RE | Admit: 2023-02-22 | Discharge: 2023-02-22 | Disposition: A | Discharge status: Home / Self Care | Payer: No Typology Code available for payment source | Source: Ambulatory Visit | Attending: Student | Admitting: Student

## 2023-02-22 ENCOUNTER — Encounter (HOSPITAL_BASED_OUTPATIENT_CLINIC_OR_DEPARTMENT_OTHER): Payer: Self-pay | Admitting: Student

## 2023-02-22 ENCOUNTER — Ambulatory Visit (HOSPITAL_BASED_OUTPATIENT_CLINIC_OR_DEPARTMENT_OTHER): Payer: No Typology Code available for payment source | Admitting: Student

## 2023-02-22 ENCOUNTER — Emergency Department (HOSPITAL_BASED_OUTPATIENT_CLINIC_OR_DEPARTMENT_OTHER): Payer: No Typology Code available for payment source

## 2023-02-22 ENCOUNTER — Telehealth (HOSPITAL_BASED_OUTPATIENT_CLINIC_OR_DEPARTMENT_OTHER): Payer: Self-pay | Admitting: Student

## 2023-02-22 DIAGNOSIS — M542 Cervicalgia: Secondary | ICD-10-CM

## 2023-02-22 DIAGNOSIS — M62838 Other muscle spasm: Secondary | ICD-10-CM | POA: Diagnosis not present

## 2023-02-22 LAB — CBC WITH DIFFERENTIAL/PLATELET
Abs Immature Granulocytes: 0.01 10*3/uL (ref 0.00–0.07)
Basophils Absolute: 0.1 10*3/uL (ref 0.0–0.1)
Basophils Relative: 1 %
Eosinophils Absolute: 0.1 10*3/uL (ref 0.0–0.5)
Eosinophils Relative: 1 %
HCT: 42 % (ref 36.0–46.0)
Hemoglobin: 14.7 g/dL (ref 12.0–15.0)
Immature Granulocytes: 0 %
Lymphocytes Relative: 22 %
Lymphs Abs: 1.8 10*3/uL (ref 0.7–4.0)
MCH: 30.2 pg (ref 26.0–34.0)
MCHC: 35 g/dL (ref 30.0–36.0)
MCV: 86.4 fL (ref 80.0–100.0)
Monocytes Absolute: 0.4 10*3/uL (ref 0.1–1.0)
Monocytes Relative: 5 %
Neutro Abs: 5.6 10*3/uL (ref 1.7–7.7)
Neutrophils Relative %: 71 %
Platelets: 296 10*3/uL (ref 150–400)
RBC: 4.86 MIL/uL (ref 3.87–5.11)
RDW: 12.3 % (ref 11.5–15.5)
WBC: 7.9 10*3/uL (ref 4.0–10.5)
nRBC: 0 % (ref 0.0–0.2)

## 2023-02-22 LAB — BASIC METABOLIC PANEL
Anion gap: 6 (ref 5–15)
BUN: 13 mg/dL (ref 6–20)
CO2: 30 mmol/L (ref 22–32)
Calcium: 9.8 mg/dL (ref 8.9–10.3)
Chloride: 103 mmol/L (ref 98–111)
Creatinine, Ser: 0.9 mg/dL (ref 0.44–1.00)
GFR, Estimated: >60 mL/min (ref >60)
Glucose, Bld: 108 mg/dL — ABNORMAL HIGH (ref 70–99)
Potassium: 3.9 mmol/L (ref 3.5–5.1)
Sodium: 139 mmol/L (ref 135–145)

## 2023-02-22 LAB — MAGNESIUM: Magnesium: 2.1 mg/dL (ref 1.7–2.4)

## 2023-02-22 MED ORDER — IOHEXOL 300 MG/ML  SOLN
100.0000 mL | Freq: Once | INTRAMUSCULAR | Status: AC | PRN
  Administered 2023-02-22: 75 mL via INTRAVENOUS

## 2023-02-22 NOTE — Progress Notes (Signed)
Chief Complaint: Neck pain     History of Present Illness:    Sherry Johnston is a 47 y.o. female presenting today for evaluation of neck pain.  She states that 3 days ago she noticed a small lump that had developed on the back of her neck.  This area is tender when pressed on.  She has also developed numbness and tingling that radiates down both arms into the hands as well as tingling on the left side of her face.  Today she has noticed increased dizziness and feeling off balance.  She does have a history of frequent migraines for which she is on medication and does get regular massages.  No other previous neck issues.   Surgical History:   None  PMH/PSH/Family History/Social History/Meds/Allergies:    Past Medical History:  Diagnosis Date   Acute mastitis of left breast 05/24/2008   Allergy    Anxiety    Bladder infection    Chronic headaches    since age 40   Constipation 11/24/2006   Dengue fever    2002   Depression    Ectopic pregnancy 11/26/2006   H/O candidiasis    H/O urinary frequency 09/13/2007   History of bulimia    History of shingles    17 yrs. old ?   Hx of migraines    Hx of migraines    with aura   Hypothyroidism    H/O   Infertility, female 12/25/2009   Nausea 11/24/2006   Spotting in first trimester 11/22/2006    @ 4-5 weeks   Past Surgical History:  Procedure Laterality Date   COLON SURGERY     ECTOPIC PREGNANCY SURGERY     x 2   MANDIBLE FRACTURE SURGERY Bilateral    SALPINGECTOMY     right   Social History   Socioeconomic History   Marital status: Married    Spouse name: Not on file   Number of children: Not on file   Years of education: Not on file   Highest education level: Not on file  Occupational History   Not on file  Tobacco Use   Smoking status: Former    Types: Cigarettes   Smokeless tobacco: Never   Tobacco comments:    Quit age 64  Vaping Use   Vaping status: Never Used   Substance and Sexual Activity   Alcohol use: No    Alcohol/week: 0.0 standard drinks of alcohol   Drug use: No   Sexual activity: Yes    Birth control/protection: Other-see comments    Comment: withdrawal  Other Topics Concern   Not on file  Social History Narrative   Caffiene; 1 cup daily coffee, 1/2 caff in pm.     Real coke.   Social Determinants of Health   Financial Resource Strain: Low Risk  (11/26/2020)   Received from [facility], [facility]   Overall Financial Resource Strain (CARDIA)    Difficulty of Paying Living Expenses: Not hard at all  Food Insecurity: No Food Insecurity (11/28/2021)   Received from [facility], [facility]   Hunger Vital Sign    Worried About Running Out of Food in the Last Year: Never true    Ran Out of Food in the Last Year: Never true  Transportation Needs: No Transportation Needs (11/26/2020)  Received from [employer], [facility]   [facility] - Transportation    Lack of Transportation (Medical): No    Lack of Transportation (Non-Medical): No  Physical Activity: Inactive (11/26/2020)   Received from [facility], [facility]   Exercise Vital Sign    Days of Exercise per Week: 0 days    Minutes of Exercise per Session: 0 min  Stress: No Stress Concern Present (11/26/2020)   Received from [facility], [facility] - Occupational Stress Questionnaire    Feeling of Stress : Not at all  Social Connections: Unknown (09/01/2021)   Received from [facility], [facility]   Social Network    Social Network: Not on file   Family History  Problem Relation Age of Onset   Migraines Mother    Cancer Mother        breast   Migraines Father    Cancer Father        prostate   Alcohol abuse Father    Hyperlipidemia Father    Mental illness Brother        ADHD   COPD Maternal Grandmother        emphysema   Cancer Maternal Grandmother        thyroid and colon   Mental illness  Maternal Grandfather    Diabetes Paternal Grandmother    Heart disease Paternal Grandmother    Hypertension Paternal Grandmother    Hyperlipidemia Paternal Grandmother    Cancer Paternal Grandfather        bladder   Allergies  Allergen Reactions   Ibuprofen Nausea And Vomiting and Other (See Comments)    Other reaction(s): Other Abdominal pain    Tape Dermatitis    Reacted to Hypafix tape used on epidural site--resolved with removal and Benadryl    Wound Dressing Adhesive Rash   Rizatriptan Other (See Comments)    Hot sweaty, weird druggy feeling.    Sumatriptan Other (See Comments)    Throat tightening.    Current Outpatient Medications  Medication Sig Dispense Refill   Atogepant (QULIPTA) 60 MG TABS Take 1 tablet (60 mg total) by mouth daily. 16 tablet 0   Atogepant (QULIPTA) 60 MG TABS Take 1 tablet (60 mg total) by mouth daily. 30 tablet 11   Atogepant (QULIPTA) 60 MG TABS Take 1 tablet (60 mg total) by mouth daily. 30 tablet 9   diclofenac Sodium (VOLTAREN) 1 % GEL Apply 4 g topically 4 (four) times daily as needed. 500 g 6   eletriptan (RELPAX) 40 MG tablet TAKE 1 tablet as needed ONCE A DAY. 12 tablet 3   FLUoxetine (PROZAC) 40 MG capsule Take 40 mg by mouth daily.     ondansetron (ZOFRAN-ODT) 8 MG disintegrating tablet ondansetron 8 mg disintegrating tablet     TYRVAYA 0.03 MG/ACT SOLN Place into both nostrils.     VYVANSE 40 MG CHEW Chew 1 tablet by mouth daily.     zonisamide (ZONEGRAN) 25 MG capsule Take 3 capsules (75 mg total) by mouth daily. 90 capsule 11   No current facility-administered medications for this visit.   CT C-SPINE NO CHARGE  Result Date: 02/22/2023 CLINICAL DATA:  Numbness along left arm and face. EXAM: CT CERVICAL SPINE WITHOUT CONTRAST TECHNIQUE: Multidetector CT imaging of the cervical spine was performed without intravenous contrast. Multiplanar CT image reconstructions were also generated. RADIATION DOSE REDUCTION: This exam was performed  according to the departmental dose-optimization program which includes automated exposure control,  adjustment of the mA and/or kV according to patient size and/or use of iterative reconstruction technique. COMPARISON:  None Available. FINDINGS: Alignment: Normal. Skull base and vertebrae: No acute fracture. Normal craniocervical junction. No suspicious bone lesions. Soft tissues and spinal canal: No prevertebral fluid or swelling. No visible canal hematoma. Disc levels:  No significant degenerative change. Upper chest: No acute findings. Other: None. IMPRESSION: No acute fracture or traumatic malalignment of the cervical spine. No significant degenerative change. Electronically Signed   By: Orvan Falconer M.D.   On: 02/22/2023 17:37   CT Soft Tissue Neck W Contrast  Result Date: 02/22/2023 CLINICAL DATA:  Neck mass, nonpulsatile. EXAM: CT NECK WITH CONTRAST TECHNIQUE: Multidetector CT imaging of the neck was performed using the standard protocol following the bolus administration of intravenous contrast. RADIATION DOSE REDUCTION: This exam was performed according to the departmental dose-optimization program which includes automated exposure control, adjustment of the mA and/or kV according to patient size and/or use of iterative reconstruction technique. CONTRAST:  75mL OMNIPAQUE IOHEXOL 300 MG/ML  SOLN COMPARISON:  None Available. FINDINGS: Pharynx and larynx: Normal. No mass or swelling. Salivary glands: No inflammation, mass, or stone. Thyroid: Normal. Lymph nodes: No suspicious cervical lymphadenopathy. Vascular: Unremarkable. Limited intracranial: Unremarkable. Visualized orbits: Unremarkable. Mastoids and visualized paranasal sinuses: Well aerated. Skeleton: No suspicious bone lesions. Upper chest: Unremarkable. Other: None. IMPRESSION: No suspicious mass or lymphadenopathy in the neck. Electronically Signed   By: Orvan Falconer M.D.   On: 02/22/2023 17:35   DG Cervical Spine Complete  Result Date:  02/22/2023 CLINICAL DATA:  Neck pain EXAM: CERVICAL SPINE - COMPLETE 4+ VIEW COMPARISON:  None Available. FINDINGS: There is no evidence of cervical spine fracture or prevertebral soft tissue swelling. Alignment is normal. No other significant bone abnormalities are identified. No dynamic instability on lateral flexion/extension. IMPRESSION: Negative cervical spine radiographs. Electronically Signed   By: Corlis Leak M.D.   On: 02/22/2023 16:00    Review of Systems:   A ROS was performed including pertinent positives and negatives as documented in the HPI.  Physical Exam :   Constitutional: NAD and appears stated age Neurological: Alert and oriented Psych: Appropriate affect and cooperative currently breastfeeding.   Comprehensive Musculoskeletal Exam:    Small firm nodule palpable on the left side of the C7 spinous process.  This area is tender to palpation.  No tenderness throughout the rest of the cervical spine or paraspinal muscles.  Full cervical ROM however this does exacerbate dizziness.  Grip strength 5/5 bilaterally.  Imaging:   Xray (cervical spine 4+ views): Mild degenerative changes noted between C2 and C6.  No evidence of acute bony abnormality.   I personally reviewed and interpreted the radiographs.   Assessment:   47 y.o. female with acute neck pain.  She has a recently developed mass over the posterior neck that has seemingly been worsening for the past few days.  This does appear to be soft tissue in nature however due to her current dizziness as well as radicular symptoms I do think it would be beneficial to have her evaluated in the emergency department today as she will likely require further imaging and workup.  Patient is agreeable to this plan and I will see her back on an as-needed basis.  Plan :    -Follow-up in the emergency department today for further evaluation -Return to clinic as needed     I personally saw and evaluated the patient, and participated  in the management and treatment  plan.  Hazle Nordmann, PA-C Orthopedics

## 2023-02-22 NOTE — ED Provider Notes (Signed)
  Physical Exam  BP 126/78   Pulse 96   Temp 98 F (36.7 C) (Oral)   Resp 19   Ht 5' 3.5" (1.613 m)   Wt 74.8 kg   SpO2 100%   BMI 28.77 kg/m   Physical Exam  Procedures  Procedures  ED Course / MDM    Medical Decision Making Amount and/or Complexity of Data Reviewed Labs: ordered. Radiology: ordered. ECG/medicine tests: ordered.  Risk Prescription drug management.   Wardell Honour, assumed care for this patient.  In brief 47 year old female presenting to the emergency department with neck pain, and numbness.  Patient was signout pending CT imaging of the neck.  CT imaging negative.  Patient does have small feels like a fibroma.  Lateral follow-up with her PCP.  No neurological deficits.       Anders Simmonds T, DO 02/22/23 Julio Sicks

## 2023-02-22 NOTE — Discharge Instructions (Signed)
Follow up with your family doc and ortho doc in the office.  Return for worsening symptoms.   Take 4 over the counter ibuprofen tablets 3 times a day or 2 over-the-counter naproxen tablets twice a day for pain. Also take tylenol 1000mg (2 extra strength) four times a day.

## 2023-02-22 NOTE — ED Provider Notes (Signed)
[facility] AT [facility] Provider Note   CSN: [id number] Arrival date & time: 02/22/23  1031     History  Chief Complaint  Patient presents with   Numbness    Sherry Johnston is a 47 y.o. female.  47 yo F with a chief complaints of a spot on the back of her neck.  She noticed this on Friday.  Had appointment today with orthopedics for neck pain.  They also appreciated this area and she developed some dizziness some radiation down her left arm and the left side of her face and they encouraged her to come down here for further evaluation.  She feels like the dizziness is persistent.  Nothing seems to make it better or worse.  Neck pain is worse with certain positions palpation.  The nodules at the base of the neck at certain body positions is more prominent.  She gets regular massages for her migraines and was never told that it was there before.        Home Medications Prior to Admission medications   Medication Sig Start Date End Date Taking? Authorizing Provider  Atogepant (QULIPTA) 60 MG TABS Take 1 tablet (60 mg total) by mouth daily. 06/08/22   Anson Fret, MD  Atogepant (QULIPTA) 60 MG TABS Take 1 tablet (60 mg total) by mouth daily. 06/08/22   Anson Fret, MD  Atogepant (QULIPTA) 60 MG TABS Take 1 tablet (60 mg total) by mouth daily. 06/08/22   Anson Fret, MD  diclofenac Sodium (VOLTAREN) 1 % GEL Apply 4 g topically 4 (four) times daily as needed. 08/27/20   Hilts, Casimiro Needle, MD  eletriptan (RELPAX) 40 MG tablet TAKE 1 tablet as needed ONCE A DAY. 11/25/22   Anson Fret, MD  FLUoxetine (PROZAC) 40 MG capsule Take 40 mg by mouth daily.    [provider]  ondansetron (ZOFRAN-ODT) 8 MG disintegrating tablet ondansetron 8 mg disintegrating tablet 07/27/19   [provider]  TYRVAYA 0.03 MG/ACT SOLN Place into both nostrils. 06/19/21   [provider]  VYVANSE 40 MG CHEW Chew 1 tablet by mouth daily.     [provider]  zonisamide (ZONEGRAN) 25 MG capsule Take 3 capsules (75 mg total) by mouth daily. 08/10/22   Anson Fret, MD      Allergies    Ibuprofen, Tape, Wound dressing adhesive, Rizatriptan, and Sumatriptan    Review of Systems   Review of Systems  Physical Exam Updated Vital Signs BP 116/76   Pulse 80   Temp 98.5 F (36.9 C) (Oral)   Resp 17   Ht 5' 3.5" (1.613 m)   Wt 74.8 kg   SpO2 100%   BMI 28.77 kg/m  Physical Exam Vitals and nursing note reviewed.  Constitutional:      General: She is not in acute distress.    Appearance: She is well-developed. She is not diaphoretic.  HENT:     Head: Normocephalic and atraumatic.  Eyes:     Pupils: Pupils are equal, round, and reactive to light.  Cardiovascular:     Rate and Rhythm: Normal rate and regular rhythm.     Heart sounds: No murmur heard.    No friction rub. No gallop.  Pulmonary:     Effort: Pulmonary effort is normal.     Breath sounds: No wheezing or rales.  Abdominal:     General: There is no distension.     Palpations: Abdomen is soft.  Tenderness: There is no abdominal tenderness.  Musculoskeletal:        General: No tenderness.     Cervical back: Normal range of motion and neck supple.     Comments: Patient has a BB sized area of mobile tissue without surrounding erythema or induration.  No appreciable fluctuance.  There is some bilateral paraspinal muscular spasm.  Skin:    General: Skin is warm and dry.  Neurological:     Mental Status: She is alert and oriented to person, place, and time.  Psychiatric:        Behavior: Behavior normal.     ED Results / Procedures / Treatments   Labs (all labs ordered are listed, but only abnormal results are displayed) Labs Reviewed  BASIC METABOLIC PANEL - Abnormal; Notable for the following components:      Result Value   Glucose, Bld 108 (*)    All other components within normal limits  CBC WITH DIFFERENTIAL/PLATELET  MAGNESIUM     EKG EKG Interpretation Date/Time:  Monday February 22 2023 10:57:01 EDT Ventricular Rate:  84 PR Interval:  153 QRS Duration:  90 QT Interval:  369 QTC Calculation: 437 R Axis:   52  Text Interpretation: Sinus rhythm Low voltage, precordial leads no wpw, prolonged qt or brugada No old tracing to compare Confirmed by Melene Plan [phone]) on 02/22/2023 12:12:52 PM  Radiology No results found.  Procedures Procedures    Medications Ordered in ED Medications  iohexol (OMNIPAQUE) 300 MG/ML solution 100 mL (75 mLs Intravenous Contrast Given 02/22/23 1258)    ED Course/ Medical Decision Making/ A&P                                 Medical Decision Making Amount and/or Complexity of Data Reviewed Labs: ordered. Radiology: ordered. ECG/medicine tests: ordered.  Risk Prescription drug management.   47 yo F with a chief complaints of neck pain.  This been going on for a few days.  She noticed a bump on the back of her neck and she went to see an orthopedic office today.  They are concerned because she also developed some dizziness.  I discussed patient care with them prior to transfer down to the ED.  On exam I think the bump is most likely a lipoma.  It is midline along the C-spine adjacent to the C7 transverse process.  I think it is less likely to be a lymph node based on its location.  She is feeling dizzy which I have trouble explaining.  EKG without arrhythmia.  Will check electrolytes.  CT imaging of the C-spine.  Reassess.  No significant electrolyte abnormality.  No leukocytosis, no concerning abnormal cell count on differential.  Awaiting CT imaging.  Patient care was signed out to Dr. Andria Meuse.  Please see their note for further detail of care in the ED.  3:12 PM:  I have discussed the diagnosis/risks/treatment options with the patient.  Evaluation and diagnostic testing in the emergency department does not suggest an emergent condition requiring admission or immediate  intervention beyond what has been performed at this time.  They will follow up with PCP. We also discussed returning to the ED immediately if new or worsening sx occur. We discussed the sx which are most concerning (e.g., sudden worsening pain, fever, inability to tolerate by mouth) that necessitate immediate return. Medications administered to the patient during their visit and any new prescriptions provided  to the patient are listed below.  Medications given during this visit Medications  iohexol (OMNIPAQUE) 300 MG/ML solution 100 mL (75 mLs Intravenous Contrast Given 02/22/23 1258)     The patient appears reasonably screen and/or stabilized for discharge and I doubt any other medical condition or other [facility] requiring further screening, evaluation, or treatment in the ED at this time prior to discharge.          Final Clinical Impression(s) / ED Diagnoses Final diagnoses:  Neck pain on left side    Rx / DC Orders ED Discharge Orders     None         Melene Plan, DO 02/22/23 1512

## 2023-02-22 NOTE — Telephone Encounter (Signed)
Spoke to [facility] ED

## 2023-02-22 NOTE — ED Triage Notes (Signed)
Pt POV from home states she had an ortho appt this morning due to a painful bump on the back of her neck. Pt c/o intermittent dizziness with numbness/tingling shooting down her left arm and up to the left side of her face. Ortho told pt to come to ER for more work up

## 2023-02-24 ENCOUNTER — Encounter: Payer: Self-pay | Admitting: Neurology

## 2023-03-01 ENCOUNTER — Other Ambulatory Visit (HOSPITAL_COMMUNITY): Payer: Self-pay | Admitting: Family Medicine

## 2023-03-01 ENCOUNTER — Ambulatory Visit (HOSPITAL_COMMUNITY)
Admission: RE | Admit: 2023-03-01 | Discharge: 2023-03-01 | Disposition: A | Discharge status: Home / Self Care | Payer: No Typology Code available for payment source | Source: Ambulatory Visit | Attending: Family Medicine | Admitting: Family Medicine

## 2023-03-01 DIAGNOSIS — R42 Dizziness and giddiness: Secondary | ICD-10-CM

## 2023-03-01 DIAGNOSIS — R202 Paresthesia of skin: Secondary | ICD-10-CM

## 2023-03-01 MED ORDER — GADOBUTROL 1 MMOL/ML IV SOLN
7.0000 mL | Freq: Once | INTRAVENOUS | Status: AC | PRN
  Administered 2023-03-01: 7 mL via INTRAVENOUS

## 2023-03-03 ENCOUNTER — Other Ambulatory Visit (HOSPITAL_COMMUNITY): Payer: Self-pay

## 2023-03-10 NOTE — Telephone Encounter (Signed)
Received urgent referral from patients PCP for these symptoms, can you please advise where we can work patient in to Dr Celene Squibb schedule?   Thanks!

## 2023-03-10 NOTE — Telephone Encounter (Signed)
Will talk to Dr Lucia Gaskins

## 2023-03-11 NOTE — Telephone Encounter (Signed)
Dr Lucia Gaskins asked to have the referral so she can review it. I am happy to grab it from you and put it in her office.

## 2023-03-24 ENCOUNTER — Encounter: Payer: Self-pay | Admitting: Neurology

## 2023-03-24 ENCOUNTER — Ambulatory Visit (INDEPENDENT_AMBULATORY_CARE_PROVIDER_SITE_OTHER): Payer: No Typology Code available for payment source | Admitting: Neurology

## 2023-03-24 VITALS — BP 124/84 | HR 98 | Ht 63.5 in | Wt 169.2 lb

## 2023-03-24 DIAGNOSIS — H5460 Unqualified visual loss, one eye, unspecified: Secondary | ICD-10-CM

## 2023-03-24 DIAGNOSIS — R29898 Other symptoms and signs involving the musculoskeletal system: Secondary | ICD-10-CM

## 2023-03-24 DIAGNOSIS — R200 Anesthesia of skin: Secondary | ICD-10-CM

## 2023-03-24 DIAGNOSIS — G959 Disease of spinal cord, unspecified: Secondary | ICD-10-CM

## 2023-03-24 DIAGNOSIS — G629 Polyneuropathy, unspecified: Secondary | ICD-10-CM | POA: Diagnosis not present

## 2023-03-24 DIAGNOSIS — R42 Dizziness and giddiness: Secondary | ICD-10-CM

## 2023-03-24 DIAGNOSIS — C7951 Secondary malignant neoplasm of bone: Secondary | ICD-10-CM

## 2023-03-24 DIAGNOSIS — R21 Rash and other nonspecific skin eruption: Secondary | ICD-10-CM

## 2023-03-24 DIAGNOSIS — R61 Generalized hyperhidrosis: Secondary | ICD-10-CM

## 2023-03-24 DIAGNOSIS — E519 Thiamine deficiency, unspecified: Secondary | ICD-10-CM | POA: Diagnosis not present

## 2023-03-24 DIAGNOSIS — E538 Deficiency of other specified B group vitamins: Secondary | ICD-10-CM

## 2023-03-24 DIAGNOSIS — E531 Pyridoxine deficiency: Secondary | ICD-10-CM

## 2023-03-24 DIAGNOSIS — H539 Unspecified visual disturbance: Secondary | ICD-10-CM

## 2023-03-24 DIAGNOSIS — R270 Ataxia, unspecified: Secondary | ICD-10-CM

## 2023-03-24 DIAGNOSIS — D8989 Other specified disorders involving the immune mechanism, not elsewhere classified: Secondary | ICD-10-CM

## 2023-03-24 MED ORDER — METHYLPREDNISOLONE 4 MG PO TBPK: ORAL_TABLET | ORAL | 1 refills | Status: DC

## 2023-03-24 NOTE — Progress Notes (Addendum)
[facility]    Provider:  Dr Lucia Gaskins Requesting Provider: Jarrett Soho, PA-C Primary Care Provider:  Jarrett Soho, PA-C  CC:  Migraine  03/24/2023: She stopped the zonegran. Her life went hay wire. She started getting breakthrough migraines. She has not had a migraine, the zonegran and the qulipta together helps. She tried stopping the zonegran and had some breakthrough and she added back the zonegran and the combination is magic, she hasn;t even had to use her relpax. She has purply mottly rash both arms, both legs, itchy, hives, sun sensitive, BUE/BLE numbness and tingling and neck mass. All started on 10/18 and acute onset numbness and tingling in the hands and feet acutely and arms, comes and goes. Halloween night had burning starting in the feet patches that move around not in one distribution she feels sunburned in the low back. But no raiation from the low back into the legs, the neuropathy starts in the feet and travels upwards and can be random like in a pattern of clouds which is no pattern, not in a dermatomal pattern either. Dermayology felt it and believes it is very deep or wouldn;t biopsy it didn;t feel it was dermatologic. Around c2/c3 feels like on the protruding vertebra hard but mobilem doesn't feel like its in the dermis or subc tissue feels like it is more bony. Fatigue. Brain fog. Extreme fatigue. Rash. She is ANA positive and joint pain, gets dizzy when pushing down on the lesion, feels like a 3mm round sphere. Dizziness. Night sweats. Vision changes blurry/diplopia, worse in the right eye - monocular vision loss. No other focal neurologic deficits, associated symptoms, inciting events or modifiable factors.  Patient complains of symptoms per HPI as well as the following symptoms: none . Pertinent negatives and positives per HPI. All others negative   Reviewed outside images and labs:  CBC    Component Value Date/Time   WBC 7.9 02/22/2023 1119    RBC 4.86 02/22/2023 1119   HGB 14.7 02/22/2023 1119   HCT 42.0 02/22/2023 1119   PLT 296 02/22/2023 1119   MCV 86.4 02/22/2023 1119   MCH 30.2 02/22/2023 1119   MCHC 35.0 02/22/2023 1119   RDW 12.3 02/22/2023 1119   LYMPHSABS 1.8 02/22/2023 1119   MONOABS 0.4 02/22/2023 1119   EOSABS 0.1 02/22/2023 1119   BASOSABS 0.1 02/22/2023 1119      Latest Ref Rng & Units 03/24/2023   11:06 AM 02/22/2023   11:19 AM 02/03/2012   12:40 PM  CMP  Glucose 70 - 99 mg/dL  540  80   BUN 6 - 20 mg/dL  13    Creatinine 9.81 - 1.00 mg/dL  1.91    Sodium 478 - 295 mmol/L  139    Potassium 3.5 - 5.1 mmol/L  3.9    Chloride 98 - 111 mmol/L  103    CO2 22 - 32 mmol/L  30    Calcium 8.9 - 10.3 mg/dL  9.8    Total Protein 6.0 - 8.5 g/dL 6.8        Recent Results (from the past 2160 hour(s))  CBC with Differential     Status: None   Collection Time: 02/22/23 11:19 AM  Result Value Ref Range   WBC 7.9 4.0 - 10.5 K/uL   RBC 4.86 3.87 - 5.11 MIL/uL   Hemoglobin 14.7 12.0 - 15.0 g/dL   HCT 62.1 30.8 - 65.7 %   MCV 86.4 80.0 - 100.0 fL   MCH 30.2 26.0 -  34.0 pg   MCHC 35.0 30.0 - 36.0 g/dL   RDW 40.9 81.1 - 91.4 %   Platelets 296 150 - 400 K/uL   nRBC 0.0 0.0 - 0.2 %   Neutrophils Relative % 71 %   Neutro Abs 5.6 1.7 - 7.7 K/uL   Lymphocytes Relative 22 %   Lymphs Abs 1.8 0.7 - 4.0 K/uL   Monocytes Relative 5 %   Monocytes Absolute 0.4 0.1 - 1.0 K/uL   Eosinophils Relative 1 %   Eosinophils Absolute 0.1 0.0 - 0.5 K/uL   Basophils Relative 1 %   Basophils Absolute 0.1 0.0 - 0.1 K/uL   Immature Granulocytes 0 %   Abs Immature Granulocytes 0.01 0.00 - 0.07 K/uL    Comment: Performed at [facility], [address], [place], [place] [address]  Basic metabolic panel     Status: Abnormal   Collection Time: 02/22/23 11:19 AM  Result Value Ref Range   Sodium 139 135 - 145 mmol/L   Potassium 3.9 3.5 - 5.1 mmol/L   Chloride 103 98 - 111 mmol/L   CO2 30 22 - 32 mmol/L   Glucose,  Bld 108 (H) 70 - 99 mg/dL    Comment: Glucose reference range applies only to samples taken after fasting for at least 8 hours.   BUN 13 6 - 20 mg/dL   Creatinine, Ser 6.21 0.44 - 1.00 mg/dL   Calcium 9.8 8.9 - 30.8 mg/dL   GFR, Estimated >65 >78 mL/min    Comment: (NOTE) Calculated using the CKD-EPI Creatinine Equation (2021)    Anion gap 6 5 - 15    Comment: Performed at [facility], [address] [place], [place] [address]  Magnesium     Status: None   Collection Time: 02/22/23 11:19 AM  Result Value Ref Range   Magnesium 2.1 1.7 - 2.4 mg/dL    Comment: Performed at [facility], [address], [place], [place] [address]  Vitamin B1     Status: None   Collection Time: 03/24/23 11:06 AM  Result Value Ref Range   Thiamine 111.4 66.5 - 200.0 nmol/L  Vitamin B6     Status: None   Collection Time: 03/24/23 11:06 AM  Result Value Ref Range   Vitamin B6 6.8 3.4 - 65.2 ug/L    Comment:                              Deficiency:         <3.4                              Marginal:      3.4 - 5.1                              Adequate:           >5.1   B12 and Folate Panel     Status: None   Collection Time: 03/24/23 11:06 AM  Result Value Ref Range   Vitamin B-12 395 232 - 1,245 pg/mL   Folate 6.4 >3.0 ng/mL    Comment: A serum folate concentration of less than 3.1 ng/mL is considered to represent clinical deficiency.   Methylmalonic acid, serum     Status: None   Collection Time: 03/24/23 11:06 AM  Result Value Ref  Range   Methylmalonic Acid 305 0 - 378 nmol/L  Multiple Myeloma Panel (SPEP&IFE w/QIG)     Status: None   Collection Time: 03/24/23 11:06 AM  Result Value Ref Range   IgG (Immunoglobin G), Serum 1,082 586 - 1,602 mg/dL   IgA/Immunoglobulin A, Serum 178 87 - 352 mg/dL   IgM (Immunoglobulin M), Srm 109 26 - 217 mg/dL   Total Protein 6.8 6.0 - 8.5 g/dL   Albumin SerPl Elph-Mcnc 3.9 2.9 - 4.4 g/dL   Alpha 1 0.2 0.0 - 0.4 g/dL    Alpha2 Glob SerPl Elph-Mcnc 0.6 0.4 - 1.0 g/dL   B-Globulin SerPl Elph-Mcnc 1.0 0.7 - 1.3 g/dL   Gamma Glob SerPl Elph-Mcnc 1.0 0.4 - 1.8 g/dL   M Protein SerPl Elph-Mcnc Not Observed Not Observed g/dL   Globulin, Total 2.9 2.2 - 3.9 g/dL   Albumin/Glob SerPl 1.4 0.7 - 1.7   IFE 1 Comment     Comment: The immunofixation pattern appears unremarkable. Evidence of monoclonal protein is not apparent.    Please Note Comment     Comment: Protein electrophoresis scan will follow via computer, mail, or courier delivery.   Angiotensin converting enzyme     Status: None   Collection Time: 03/24/23 11:06 AM  Result Value Ref Range   Angio Convert Enzyme 54 14 - 82 U/L  Sedimentation rate     Status: None   Collection Time: 03/24/23 11:06 AM  Result Value Ref Range   Sed Rate 4 0 - 32 mm/hr  ANA Comprehensive Panel     Status: None   Collection Time: 03/24/23 11:06 AM  Result Value Ref Range   dsDNA Ab <1 0 - 9 IU/mL    Comment:                                    Negative      <5                                    Equivocal  5 - 9                                    Positive      >9    ENA RNP Ab <0.2 0.0 - 0.9 AI   ENA SM Ab Ser-aCnc <0.2 0.0 - 0.9 AI   Scleroderma (Scl-70) (ENA) Antibody, IgG <0.2 0.0 - 0.9 AI   ENA SSA (RO) Ab <0.2 0.0 - 0.9 AI   ENA SSB (LA) Ab <0.2 0.0 - 0.9 AI   Chromatin Ab SerPl-aCnc <0.2 0.0 - 0.9 AI   Anti JO-1 <0.2 0.0 - 0.9 AI   Centromere Ab Screen <0.2 0.0 - 0.9 AI   See below: Comment     Comment: Autoantibody                       Disease Association ------------------------------------------------------------                         Condition                  Frequency ---------------------   ------------------------   --------- Antinuclear Antibody,    SLE, mixed connective  Direct (ANA-D)           tissue diseases ---------------------   ------------------------   --------- dsDNA                    SLE                        40 -  60% ---------------------   ------------------------   --------- Chromatin                Drug induced SLE                90%                          SLE                        48 - 97% ---------------------   ------------------------   --------- SSA (Ro)                 SLE                        25 - 35%                          Sjogren's Syndrome         40 - 70%                          Neonatal Lupus                 100% ---------------------   ------------------------   --------- SSB (La)                 SLE                              10%                          Sjogren's Syndrome              30% ---------------------   -----------------------    --------- Sm (anti-Smith)          SLE                        15 - 30% ---------------------   -----------------------    --------- RNP                      Mixed Connective Tissue                          Disease                         95% (U1 nRNP,                SLE                        30 - 50% anti-ribonucleoprotein)  Polymyositis and/or                          Dermatomyositis  20% ---------------------   ------------------------   --------- Scl-70 (antiDNA          Scleroderma (diffuse)      20 - 35% topoisomerase)           Crest                           13% ---------------------   ------------------------   --------- Jo-1                     Polymyositis and/or                          Dermatomyositis            20 - 40% ---------------------   ------------------------   --------- Centromere B             Scleroderma -  Crest                          variant                         80%   ANA, IFA (with reflex)     Status: Abnormal   Collection Time: 03/24/23 11:06 AM  Result Value Ref Range   ANA Titer 1 Positive (A)     Comment:                                      Negative   <1:80                                      Borderline  1:80                                      Positive   >1:80    C-reactive protein     Status: None   Collection Time: 03/24/23 11:06 AM  Result Value Ref Range   CRP 2 0 - 10 mg/L  Vitamin D, 25-hydroxy     Status: Abnormal   Collection Time: 03/24/23 11:06 AM  Result Value Ref Range   Vit D, 25-Hydroxy 23.9 (L) 30.0 - 100.0 ng/mL    Comment: Vitamin D deficiency has been defined by the Institute of Medicine and an Endocrine Society practice guideline as a level of serum 25-OH vitamin D less than 20 ng/mL (1,2). The Endocrine Society went on to further define vitamin D insufficiency as a level between 21 and 29 ng/mL (2). 1. IOM (Institute of Medicine). 2010. Dietary reference    intakes for calcium and D. [place]: The    [employer]. 2. Holick MF, Binkley Romney, Bischoff-Ferrari HA, et al.    Evaluation, treatment, and prevention of vitamin D    deficiency: an Endocrine Society clinical practice    guideline. JCEM. 2011 Jul; 96(7):1911-30.   ANCA Profile     Status: None   Collection Time: 03/24/23 11:06 AM  Result Value Ref Range   Anti-MPO Antibodies <0.2 0.0 - 0.9 units   Anti-PR3 Antibodies <0.2 0.0 - 0.9 units   C-ANCA <1:20 Neg:<1:20 titer   P-ANCA <  1:20 Neg:<1:20 titer    Comment: The presence of positive fluorescence exhibiting P-ANCA or C-ANCA patterns alone is not specific for the diagnosis of Wegener's Granulomatosis (WG) or microscopic polyangiitis. Decisions about treatment should not be based solely on ANCA IFA results.  The International ANCA Group Consensus recommends follow up testing of positive sera with both PR-3 and MPO-ANCA enzyme immunoassays. As many as 5% serum samples are positive only by EIA. Ref. AM J Clin Pathol 1999;111:507-513.    Atypical pANCA <1:20 Neg:<1:20 titer    Comment: The atypical pANCA pattern has been observed in a significant percentage of patients with ulcerative colitis, primary sclerosing cholangitis and autoimmune hepatitis.   FANA Staining Patterns     Status: Abnormal    Collection Time: 03/24/23 11:06 AM  Result Value Ref Range   Homogeneous Pattern 1:160 (H)     Comment: ICAP nomenclature: AC-1   Speckled Pattern 1:160 (H)     Comment: ICAP nomenclature: AC-2,4,5,29   Note: Comment     Comment: Pattern              Potential Disease Association -------------  --------------------------------------------- Homogeneous    Systemic Lupus Erythematosus, Drug Induced                Systemic Lupus Erythematosus, Chronic                Autoimmune hepatitis, Juvenile Idiopathic                Arthritis -------------  --------------------------------------------- Speckled       Sjogren Syndrome, Systemic Lupus                Erythematosus, Subacute Cutaneous Lupus,                Neonatal Lupus, Congenital Heart Block,                Mixed Connective Tissue Disease,                Scleroderma-diffuse, Scleroderma-Autoimmune                Myositis Overlap Syndrome, Systemic Lupus                Erythematosus-Scleroderma-Autoimmune                Myositis Overlap Syndrome, Systemic                Autoimmune Rheumatic Disease,                Undifferentiated Connective Tissue Disease -------------  --------------------------------------------- Nucleolar      Systemic [place] lerosis, Scleroderma-Autoimmune                Myositis Overlap Syndrome, Sjogren                Syndrome, Raynaud phenomenon, Pulmonary                Arterial Hypertension, Systemic Autoimmune                Rheumatic Disease, Cancer -------------  --------------------------------------------- Centromere     Scleroderma-CREST, Limited Cutaneous SSc,                Raynaud's Phenomenon, Primary Biliary                Cholangitis -------------  --------------------------------------------- Nuclear Dot    Primary Biliary Cholangitis -------------  --------------------------------------------- Nuclear        Primary Biliary Cholangitis, Autoimmune  Membrane       Hepatitis/Liver disease,  Systemic Autoimmune                Rheumatic Disease, Autoimmune Cytopenias,                Linear Scleroderma, Antiphospholipid Syndrome -------------  ---------------------------------------------   Anti-Hu, Anti-Ri, Anti-Yo IFA     Status: None   Collection Time: 03/24/23 11:14 AM  Result Value Ref Range   Anti-Hu Ab Negative Negative   Anti-Ri Ab Negative Negative   Anti-Yo Ab Negative Negative      MR ANGIO NECK W WO CONTRAST Final result 03/01/2023 2:22 PM    Narrative  CLINICAL DATA:  Dizziness.  Facial tingling.  EXAM: MRA NECK WITHOUT AND WITH CONTRAST  TECHNIQUE: Multiplanar and multiecho pulse sequences of the neck were obtained without and with intravenous contrast. Angiographic images of the neck were obtained using MRA technique without and with intravenous contrast. ...       Addendum  ADDENDUM REPORT: 03/10/2023 13:02   ADDENDUM: Correction to exam and technique, MRI of the neck with and without contrast was performed.     Electronically Signed   By: Orvan Falconer M.D.   On: 03/10/2023 13:02    Addended by Ardyth Gal, MD on 03/10/2023  1:04 PM  ADDENDUM REPORT: 03/02/2023 12:27   ADDENDUM: CORRECTION: There was a voice recognition error in the dictation of the findings. The "Lymph Nodes" section of the Findings should read "NO suspicious cervical lymphadenopathy".     Electronically Signed   By: Orvan Falconer M.D.     Addendum 03/10/2023  1:04 PM  ADDENDUM REPORT: 03/10/2023 13:02  ADDENDUM: Correction to exam and technique, MRI of the neck with and without contrast was performed.   Electronically Signed   By: Orvan Falconer M.D.   On: 03/10/2023 13:02 ...  Signed by Ardyth Gal, MD on 03/10/2023  1:04 PM     Addendum 03/02/2023 12:29 PM  ADDENDUM REPORT: 03/02/2023 12:27  ADDENDUM: CORRECTION: There was a voice recognition error in the dictation of the findings. The "Lymph Nodes" section of the Findings should  read "NO suspicious cervical lymphadenopathy".   Electronically Signed   By: Orvan Falconer M.D. Marland Kitchen..  Signed by Ardyth Gal, MD on 03/02/2023 12:29 PM     Narrative  CLINICAL DATA:  Dizziness, facial tingling.  EXAM: MRI OF THE NECK WITH CONTRAST  TECHNIQUE: Multiplanar, multisequence MR imaging was performed following the administration of intravenous contrast.  CONTRAST:  7mL GADAVIST GADOBUTROL 1 MMOL/ML IV SOLN ...        Study Result  Narrative & Impression  CLINICAL DATA:  Dizziness.  Facial tingling.   EXAM: MRA NECK WITHOUT AND WITH CONTRAST   TECHNIQUE: Multiplanar and multiecho pulse sequences of the neck were obtained without and with intravenous contrast. Angiographic images of the neck were obtained using MRA technique without and with intravenous contrast.   CONTRAST:  7mL GADAVIST GADOBUTROL 1 MMOL/ML IV SOLN   COMPARISON:  Neck CT 02/22/2023.   FINDINGS: Three-vessel arch configuration.  Arch vessel origins are patent.   No evidence of dissection, stenosis (50% or greater), or occlusion of the carotid arteries.   Codominant vertebral arteries. No evidence of dissection, stenosis (50% or greater), or occlusion.   No evidence of vascular malformation of the neck vessels.   IMPRESSION: Normal MRA of the neck.     Electronically Signed   By: Elwyn Reach.D.  On: 03/01/2023 17:05    CT soft tissue of the neck:  Narrative  CLINICAL DATA:  Neck mass, nonpulsatile.  EXAM: CT NECK WITH CONTRAST  TECHNIQUE: Multidetector CT imaging of the neck was performed using the standard protocol following the bolus administration of intravenous contrast.  ...        Study Result  Narrative & Impression  CLINICAL DATA:  Numbness along left arm and face.   EXAM: CT CERVICAL SPINE WITHOUT CONTRAST   TECHNIQUE: Multidetector CT imaging of the cervical spine was performed without intravenous contrast. Multiplanar CT image  reconstructions were also generated.   RADIATION DOSE REDUCTION: This exam was performed according to the departmental dose-optimization program which includes automated exposure control, adjustment of the mA and/or kV according to patient size and/or use of iterative reconstruction technique.   COMPARISON:  None Available.   FINDINGS: Alignment: Normal.   Skull base and vertebrae: No acute fracture. Normal craniocervical junction. No suspicious bone lesions.   Soft tissues and spinal canal: No prevertebral fluid or swelling. No visible canal hematoma.   Disc levels:  No significant degenerative change.   Upper chest: No acute findings.   Other: None.   IMPRESSION: No acute fracture or traumatic malalignment of the cervical spine. No significant degenerative change.     Narrative  CLINICAL DATA:  Numbness along left arm and face.  EXAM: CT CERVICAL SPINE WITHOUT CONTRAST  TECHNIQUE: Multidetector CT imaging of the cervical spine was performed without intravenous contrast. Multiplanar CT image reconstructions were also generated.  ...        Study Result  Narrative & Impression  CLINICAL DATA:  Neck mass, nonpulsatile.   EXAM: CT NECK WITH CONTRAST   TECHNIQUE: Multidetector CT imaging of the neck was performed using the standard protocol following the bolus administration of intravenous contrast.   RADIATION DOSE REDUCTION: This exam was performed according to the departmental dose-optimization program which includes automated exposure control, adjustment of the mA and/or kV according to patient size and/or use of iterative reconstruction technique.   CONTRAST:  75mL OMNIPAQUE IOHEXOL 300 MG/ML  SOLN   COMPARISON:  None Available.   FINDINGS: Pharynx and larynx: Normal. No mass or swelling.   Salivary glands: No inflammation, mass, or stone.   Thyroid: Normal.   Lymph nodes: No suspicious cervical lymphadenopathy.   Vascular: Unremarkable.    Limited intracranial: Unremarkable.   Visualized orbits: Unremarkable.   Mastoids and visualized paranasal sinuses: Well aerated.   Skeleton: No suspicious bone lesions.   Upper chest: Unremarkable.   Other: None.   IMPRESSION: No suspicious mass or lymphadenopathy in the neck.     Electronically Signed   By: Orvan Falconer M.D.   On: 02/22/2023 17:35      08/10/2022: She is doing great. She has only taken one Relpax since laving my office in January. Doing Fantastic! Continue Qulipta. Relpax worked Firef[employer].  She has constipation, mag citrate helps but mag oxide causes a migraine. She is thrilled. She has a little heartburn. Famotidine for a little bit helped. Otherwise everything is great. She has jaw tightness and also she is on zonisamide. Could decrease to 50 and if the migraines get worse go right back up. Can decrease zonisamide to 75 then 50 then 25 and if the migraines come back go back to latest dose.   Patient complains of symptoms per HPI as well as the following symptoms: none . Pertinent negatives and positives per HPI. All others negative   HPI:  Sherry Johnston is a 47 y.o. female here as requested by Jarrett Soho, PA-C for migraines. Transitioning from [facility]. Migraines since the age of 65, she has tried everything, she has been to [facility], [facility], she has tried and failed multiple medications, tried calendars and food diaries. Botox helped. Relpax is the only thing that has ever helped stop the headache consistently. Worsening on Tgiving, On average she has having 5-8 migraines a month and < 10 total headache days a month. She has even tried botox with the the injectibles that helped. Weather is a trigger. She has visual static but aura rarely. No medication oberuse. Her migraines are pounding/pulsating.throbbing, light and sound sesitivity, nausea, moving makes it hurt, weather is huge trigger, hormones, discussed migraine  with aura and contraindication for estrogen but if very hormonal and understands the risks then keeping her estrogen stable can help migraines, could try to stop periods for 3 months the IUD di dnot stop it has helped with the hormonal fluctuations. Sensitive to sugar. She has had thorough evaluation with courtney wharton. She has had MRIs of the brain twice last completed with [facility]. The migraines can be in the back of the left head, or behind the left eye/forehead points to the supratrochlear nerve and can spread to the top of the head and into the jaw, she gets trigger point massage for cervical myofascial pain and masseters hypertrophy and tightness, very tight muscles. Has had vomiting, has had to go to the ED for toradol. No other focal neurologic deficits, associated symptoms, inciting events or modifiable factors.  Reviewed notes, labs and imaging from outside physicians, which showed:  ANALGESICS: BC, Goody's, Codeine, Esig, Excedrin, Fioricet, Lorcet, Percocet, Tylenol, Vicodin ANTI-MIGRAINE: DHE inj, Imitrex in, Maxalt, Relpax, Zomig nasal, Nurtec every other day-ineffective, Ubrelvy- ineffective, Reyvow-side effects HEART/BP: Inderal, Verapamil DECONGESTANT/ANTIHISTAMINE: Allegra, Benadryl, Flonase, Sudafed ANTI-NAUSEANT:zofran NSAIDS: Advil, Aleve, Celebrex MUSCLE RELAXANTS: Baclofen, Flexeril ANTI-CONVULSANTS: Depakote, Klonopin, Topamax, Zonegran-very effective STEROIDS: Prednisone SLEEPING PILLS/TRANQUILIZERS: Tylenol PM ANTI-DEPRESSANTS: Effexor, Amitriptyline HERBAL: Coenzyme, Magnesium FIBROMYALGIA:  HORMONAL: Yasmine, progesterone OTHER: Aimovig, Emgality, Ajovy-unable to get approved PROCEDURES FOR HEADACHES: Botox   Review of Systems: Patient complains of symptoms per HPI as well as the following symptoms migraines. Pertinent negatives and positives per HPI. All others negative.   Social History   Socioeconomic History   Marital status: Married    Spouse name: Not  on file   Number of children: Not on file   Years of education: Not on file   Highest education level: Not on file  Occupational History   Not on file  Tobacco Use   Smoking status: Former    Types: Cigarettes   Smokeless tobacco: Never   Tobacco comments:    Quit age 87  Vaping Use   Vaping status: Never Used  Substance and Sexual Activity   Alcohol use: No    Alcohol/week: 0.0 standard drinks of alcohol   Drug use: No   Sexual activity: Yes    Birth control/protection: Other-see comments    Comment: withdrawal  Other Topics Concern   Not on file  Social History Narrative   Caffiene; 1 cup daily coffee, 1/2 caff in pm.     Real coke.   Social Determinants of Health   Financial Resource Strain: Low Risk  (11/26/2020)   Received from [facility], [facility]   Overall Financial Resource Strain (CARDIA)    Difficulty of Paying Living Expenses: Not hard at all  Food Insecurity: No Food  Insecurity (11/28/2021)   Received from [facility], [facility]   Hunger Vital Sign    Worried About Running Out of Food in the Last Year: Never true    Ran Out of Food in the Last Year: Never true  Transportation Needs: No Transportation Needs (11/26/2020)   Received from [facility] [facility], [facility]   [facility] - Transportation    Lack of Transportation (Medical): No    Lack of Transportation (Non-Medical): No  Physical Activity: Inactive (11/26/2020)   Received from [facility], [facility]   Exercise Vital Sign    Days of Exercise per Week: 0 days    Minutes of Exercise per Session: 0 min  Stress: No Stress Concern Present (11/26/2020)   Received from [facility], [facility] - Occupational Stress Questionnaire    Feeling of Stress : Not at all  Social Connections: Unknown (09/01/2021)   Received from [facility], [facility]   Social Network    Social Network: Not on file  Intimate Partner Violence: Unknown (08/05/2021)    Received from [facility], [facility]   HITS    Physically Hurt: Not on file    Insult or Talk Down To: Not on file    Threaten Physical Harm: Not on file    Scream or Curse: Not on file    Family History  Problem Relation Age of Onset   Stroke Mother    Migraines Mother    Cancer Mother        breast   Atrial fibrillation Mother    Migraines Father    Cancer Father        prostate   Alcohol abuse Father    Hyperlipidemia Father    ADD / ADHD Father    Mental illness Brother        ADHD   COPD Maternal Grandmother        emphysema   Cancer Maternal Grandmother        thyroid and colon   Mental illness Maternal Grandfather    Diabetes Paternal Grandmother    Heart disease Paternal Grandmother    Hypertension Paternal Grandmother    Hyperlipidemia Paternal Grandmother    Cancer Paternal Grandfather        bladder    Past Medical History:  Diagnosis Date   Acute mastitis of left breast 05/24/2008   Allergy    Anxiety    Bladder infection    Chronic headaches    since age 76   Constipation 11/24/2006   Dengue fever    2002   Depression    Ectopic pregnancy 11/26/2006   H/O candidiasis    H/O urinary frequency 09/13/2007   History of bulimia    History of shingles    17 yrs. old ?   Hx of migraines    Hx of migraines    with aura   Hypothyroidism    H/O   Infertility, female 12/25/2009   Nausea 11/24/2006   Spotting in first trimester 11/22/2006    @ 4-5 weeks    Patient Active Problem List   Diagnosis Date Noted   Migraine 08/27/2020   Family history of breast cancer 10/08/2015   Chronic migraine without aura 11/21/2013   SVD (spontaneous vaginal delivery) 04/19/2012   Flu vaccine need 02/25/2012   Elevated glucose tolerance test 01/25/2012   Infertility, female 10/13/2011   History of ectopic pregnancy 09/17/2011   Hypothyroidism 09/17/2011   H/O: depression 09/17/2011  Smoker 09/17/2011    Past Surgical History:  Procedure Laterality  Date   COLON SURGERY     ECTOPIC PREGNANCY SURGERY     x 2   MANDIBLE FRACTURE SURGERY Bilateral    SALPINGECTOMY     right    Current Outpatient Medications  Medication Sig Dispense Refill   Atogepant (QULIPTA) 60 MG TABS Take 1 tablet (60 mg total) by mouth daily. 30 tablet 9   diclofenac Sodium (VOLTAREN) 1 % GEL Apply 4 g topically 4 (four) times daily as needed. 500 g 6   eletriptan (RELPAX) 40 MG tablet TAKE 1 tablet as needed ONCE A DAY. 12 tablet 3   FLUoxetine (PROZAC) 40 MG capsule Take 40 mg by mouth daily.     methylPREDNISolone (MEDROL DOSEPAK) 4 MG TBPK tablet Take pills daily all together with food. Take the first dose (6 pills) as soon as possible. Take the rest each morning. For 6 days total 6-5-4-3-2-1. 21 tablet 1   ondansetron (ZOFRAN-ODT) 8 MG disintegrating tablet ondansetron 8 mg disintegrating tablet     TYRVAYA 0.03 MG/ACT SOLN Place into both nostrils.     VYVANSE 40 MG CHEW Chew 1 tablet by mouth daily.     zonisamide (ZONEGRAN) 25 MG capsule Take 3 capsules (75 mg total) by mouth daily. 90 capsule 11   No current facility-administered medications for this visit.    Allergies as of 03/24/2023 - Review Complete 03/24/2023  Allergen Reaction Noted   Ibuprofen Nausea And Vomiting and Other (See Comments) 11/21/2013   Tape Dermatitis 04/19/2012   Wound dressing adhesive Rash 10/25/2020   Hydrocortisone  03/24/2023   Rizatriptan Other (See Comments) 05/27/2021   Sumatriptan Other (See Comments) 06/23/2005    Vitals: BP 124/84   Pulse 98   Ht 5' 3.5" (1.613 m)   Wt 169 lb 3.2 oz (76.7 kg)   BMI 29.50 kg/m  Last Weight:  Wt Readings from Last 1 Encounters:  03/24/23 169 lb 3.2 oz (76.7 kg)   Last Height:   Ht Readings from Last 1 Encounters:  03/24/23 5' 3.5" (1.613 m)   Physical exam: Exam: Gen: NAD, conversant, well nourised,  well groomed                     CV: RRR, no MRG. No Carotid Bruits. No peripheral edema, warm, nontender Eyes:  Conjunctivae clear without exudates or hemorrhage MSK: Around c2/c3 feels like on the protruding vertebra hard but mobilem doesn't feel like its in the dermis or subc tissue feels like it is more bony. Fatigue. Brain fog. Extreme fatigue. Rash. She is ANA positive and joint pain, gets dizzy when pushing down on the lesion, feels like a 3mm round sphere. Dizziness. Night sweats. Vision changes blurry/diplopia, worse in the right eye - monocular vision loss.  Neuro: Detailed Neurologic Exam  Speech:    Speech is normal; fluent and spontaneous with normal comprehension.  Cognition:    The patient is oriented to person, place, and time;     recent and remote memory intact;     language fluent;     normal attention, concentration,     fund of knowledge Cranial Nerves:    The pupils are equal, round, and reactive to light. The fundi are normal and spontaneous venous pulsations are present. Visual fields are full to finger confrontation. Extraocular movements are intact. Trigeminal sensation is intact and the muscles of mastication are normal. The face is symmetric. The palate elevates in  the midline. Hearing intact. Voice is normal. Shoulder shrug is normal. The tongue has normal motion without fasciculations.   Coordination: nml  Gait:    Heel-toe and tandem gait with imbalance, reports ataxic symptoms.   Motor Observation:    No asymmetry, no atrophy, and no involuntary movements noted. Tone:    Normal muscle tone.    Posture:    Posture is normal. normal erect    Strength: weak grip. Otherwise    Strength is V/V in the upper and lower limbs.      Sensation: intact to LT     Reflex Exam:  DTR's:    Deep tendon reflexes in the upper and lower extremities are normal bilaterally.   Toes:    The toes are downgoing bilaterally.   Clonus:    Clonus is absent.    Assessment/Plan:  patient with an extremely long history of migraines tried multiple medications including injections and  botox. Relpax helps acutely. Toradol injections help as well. Today she has Lesion on what feels like the c2-c3 or c3-c4 vertebrae centrally with systemic issues needs thorough evaluation.    Lesion on what feels like the c2-c3 or c3-c4 vertebrae centrally with systemic issues, need ct abd/pelvus for metastatic screen : Around c2/c3 feels like on the spinous process hard but mobilem doesn't feel like its in the dermis or subc tissue feels like it is more bony. Fatigue. Brain fog. Extreme fatigue. Rash. She is ANA positive and joint pain, gets dizzy when pushing down on the lesion, feels like a 3mm round sphere. Dizziness. Night sweats. Vision changes blurry/diplopia, worse in the right eye - monocular vision loss. Pan scan CT ABD/PELVIS to look for other etiology Labs together for neuropathy MRI brain w/wo contrast please ensure to go down to c3-c4  evaluate for demyelinating disease or other MRi cervical spine evaluate for c-spine nodule and possibility of lesion or MS due to multiple neurologic symptoms Call over to Dr. Azucena Fallen Rheumatology and see which labs she ordered Send test results back to [facility] out to dr Hardie Shackleton  and see if she can take a medrol dosepak Emg/ncs - make sooner appt in December Send message after new MRI brain review (ask dr sater to read)  Orders Placed This Encounter  Procedures   MR BRAIN W WO CONTRAST   CT ABDOMEN PELVIS W CONTRAST   MR CERVICAL SPINE W WO CONTRAST   Vitamin B1   Vitamin B6   B12 and Folate Panel   Methylmalonic acid, serum   Multiple Myeloma Panel (SPEP&IFE w/QIG)   Angiotensin converting enzyme   Sedimentation rate   Anti-Hu, Anti-Ri, Anti-Yo IFA   ANA Comprehensive Panel   ANA, IFA (with reflex)   C-reactive protein   Vitamin D, 25-hydroxy   ANCA Profile   FANA Staining Patterns   NCV with EMG(electromyography)   Meds ordered this encounter  Medications   methylPREDNISolone (MEDROL DOSEPAK) 4  MG TBPK tablet    Sig: Take pills daily all together with food. Take the first dose (6 pills) as soon as possible. Take the rest each morning. For 6 days total 6-5-4-3-2-1.    Dispense:  21 tablet    Refill:  1      08/10/2022: She is doing great. She has only taken one Relpax since laving my office in January. Doing Fantastic! Continue Qulipta. Relpax worked Firefighter.  She has constipation, mag citrate helps but mag oxide causes a migraine. She is thrilled. She has  a little heartburn. Famotidine for a little bit helped. Otherwise everything is great. She has jaw tightness and also she is on zonisamide. Could decrease to 50 and if the migraines get worse go right back up. Can decrease zonisamide to 75 then 50 then 25 and if the migraines come back go back to latest dose.   Decrease zonisamide by 25mg  every few weeks if the migraine start again then go back to previous dose.  Continue qulipta and relpax  PRIOR Assessment and plan:  Would try Qulipta and see how that goes. After that would add on possibly botox of orther if needed. Try Bennie Pierini: [facility] - [place], [place] - [address], [address] [phone]  Continue Relpax Can consider Toradol injections at home as secondary for intractable migaines so she doesn't have to go to the ED After that would layer on other options such as botox (she is big clencher, may consider that as well) F/u 8 weeks Another medication called Vyepti as well Discussed non pharmaceutical tools (see AVS)  Meds ordered this encounter  Medications   methylPREDNISolone (MEDROL DOSEPAK) 4 MG TBPK tablet    Sig: Take pills daily all together with food. Take the first dose (6 pills) as soon as possible. Take the rest each morning. For 6 days total 6-5-4-3-2-1.    Dispense:  21 tablet    Refill:  1    Cc: Jarrett Soho, PA-C,  Jarrett Soho, PA-C  Naomie Dean, MD  [facility] [address] [place],  [place] [address]  Phone [phone] Fax [phone]

## 2023-03-24 NOTE — Patient Instructions (Addendum)
Lesion on what feels like the c2-c3 or c3-c4 vertebrae centrally with systemic issues Pan scan CT ABD/PELVIS to look for other etiology Labs together for neuropathy MRI brain w/wo contrast please ensure to go down to c3-c4 and will get into the T3 machine. Call Dr. Alfredo Batty to see if he can read.OR show MRi to Dr. Epimenio Foot and have MRI done here and have Sater read it.  Call over to Dr. Azucena Fallen Rheumatology and see which labs she ordered Send test results back to [facility] out to dr Hardie Shackleton  and see if she can take a medrol dosepak Emg/ncs - make sooner appt in December Send message after review with sater  Electromyoneurogram Electromyoneurogram is a test to check how well your muscles and nerves are working. This procedure includes the combined use of electromyogram (EMG) and nerve conduction study (NCS). EMG is used to evaluate muscles and the nerves that control those muscles. NCS, which is also called electroneurogram, measures how well your nerves conduct electricity. The procedures should be done together to check if your muscles and nerves are healthy. If the results of the tests are abnormal, this may indicate disease or injury, such as a neuromuscular disease or peripheral nerve damage. Tell a health care provider about: Any allergies you have. All medicines you are taking, including vitamins, herbs, eye drops, creams, and over-the-counter medicines. Any bleeding problems you have. Any surgeries you have had. Any medical conditions you have. What are the risks? Generally, this is a safe procedure. However, problems may occur, including: Bleeding or bruising. Infection where the electrodes were inserted. What happens before the test? Medicines Take all of your usually prescribed medications before this testing is performed. Do not stop your blood thinners unless advised by your prescribing physician. General instructions Your health care provider may  ask you to warm the limb that will be checked with warm water, hot pack, or wrapping the limb in a blanket. Do not use lotions or creams on the same day that you will be having the procedure. What happens during the test? For EMG  Your health care provider will ask you to stay in a position so that the muscle being studied can be accessed. You will be sitting or lying down. You may be given a medicine to numb the area (local anesthetic) and the skin will be disinfected. A very thin needle that has an electrode will be inserted into your muscle, one muscle at a time. Typically, multiple muscles are evaluated during a single study. Another small electrode will be placed on your skin near the muscle. Your health care provider will ask you to continue to remain still. The electrodes will record the electrical activity of your muscles. You may see this on a monitor or hear it in the room. After your muscles have been studied at rest, your health care provider will ask you to contract or flex your muscles. The electrodes will record the electrical activity of your muscles. Your health care provider will remove the electrodes and the electrode needle when the procedure is finished. The procedure may vary among health care providers and hospitals. For NCS  An electrode that records your nerve activity (recording electrode) will be placed on your skin by the muscle that is being studied. An electrode that is used as a reference (reference electrode) will be placed near the recording electrode. A paste or gel will be applied to your skin between the recording electrode and the reference electrode. Your  nerve will be stimulated with a mild shock. The speed of the nerves and strength of response is recorded by the electrodes. Your health care provider will remove the electrodes and the gel when the procedure is finished. The procedure may vary among health care providers and hospitals. What can I expect  after the test? It is up to you to get your test results. Ask your health care provider, or the department that is doing the test, when your results will be ready. Your health care provider may: Give you medicines for any pain. Monitor the insertion sites to make sure that bleeding stops. You should be able to drive yourself to and from the test. Discomfort can persist for a few hours after the test, but should be better the next day. Contact a health care provider if: You have swelling, redness, or drainage at any of the insertion sites. Summary Electromyoneurogram is a test to check how well your muscles and nerves are working. If the results of the tests are abnormal, this may indicate disease or injury. This is a safe procedure. However, problems may occur, such as bleeding and infection. Your health care provider will do two tests to complete this procedure. One checks your muscles (EMG) and another checks your nerves (NCS). It is up to you to get your test results. Ask your health care provider, or the department that is doing the test, when your results will be ready. This information is not intended to replace advice given to you by your health care provider. Make sure you discuss any questions you have with your health care provider. Document Revised: 01/01/2021 Document Reviewed: 12/01/2020 Elsevier Patient Education  2024 [employer].

## 2023-03-25 ENCOUNTER — Encounter: Payer: Self-pay | Admitting: Neurology

## 2023-03-25 LAB — ANTI-HU, ANTI-RI, ANTI-YO IFA
Anti-Hu Ab: NEGATIVE
Anti-Ri Ab: NEGATIVE
Anti-Yo Ab: NEGATIVE

## 2023-03-28 ENCOUNTER — Telehealth: Payer: Self-pay | Admitting: Neurology

## 2023-03-28 NOTE — Telephone Encounter (Signed)
Can we please get labs from [facility] ([facility] please keep it on your radar and let me know when we get them, we should have them back in 1-2 days max): Dr. Azucena Fallen Rheumatol[facility].   Also, can we place a call to the office of Dr. Hardie Shackleton and see if it ok to try some steroids with patient? She wanted [place] to make sure.

## 2023-03-29 ENCOUNTER — Telehealth: Payer: Self-pay | Admitting: Neurology

## 2023-03-29 NOTE — Telephone Encounter (Signed)
All labs are back except one that Dr Lucia Gaskins had ordered. I will get fax number from Dr Mirna Mires office and send them all results.

## 2023-03-29 NOTE — Telephone Encounter (Signed)
Spoke with Andrey Campanile at [facility]. Fax number is [phone]. She will ask Dr Hardie Shackleton if ok to prescribe steroids after they have reviewed the lab results.

## 2023-03-29 NOTE — Telephone Encounter (Signed)
Labs faxed to [facility]. Received a receipt of confirmation.

## 2023-03-29 NOTE — Telephone Encounter (Signed)
sent to GI they obtain [employer] auth [id number] 

## 2023-03-31 ENCOUNTER — Other Ambulatory Visit (HOSPITAL_COMMUNITY): Payer: Self-pay

## 2023-03-31 LAB — MULTIPLE MYELOMA PANEL, SERUM
Albumin SerPl Elph-Mcnc: 3.9 g/dL (ref 2.9–4.4)
Albumin/Glob SerPl: 1.4 (ref 0.7–1.7)
Alpha 1: 0.2 g/dL (ref 0.0–0.4)
Alpha2 Glob SerPl Elph-Mcnc: 0.6 g/dL (ref 0.4–1.0)
B-Globulin SerPl Elph-Mcnc: 1 g/dL (ref 0.7–1.3)
Gamma Glob SerPl Elph-Mcnc: 1 g/dL (ref 0.4–1.8)
Globulin, Total: 2.9 g/dL (ref 2.2–3.9)
IgA/Immunoglobulin A, Serum: 178 mg/dL (ref 87–352)
IgG (Immunoglobin G), Serum: 1082 mg/dL (ref 586–1602)
IgM (Immunoglobulin M), Srm: 109 mg/dL (ref 26–217)
Total Protein: 6.8 g/dL (ref 6.0–8.5)

## 2023-03-31 LAB — ANCA PROFILE
Anti-MPO Antibodies: <0.2 U (ref 0.0–0.9)
Anti-PR3 Antibodies: <0.2 U (ref 0.0–0.9)
Atypical pANCA: <1:20 {titer}
C-ANCA: <1:20 {titer}
P-ANCA: <1:20 {titer}

## 2023-03-31 LAB — ANA COMPREHENSIVE PANEL
Anti JO-1: <0.2 AI (ref 0.0–0.9)
Centromere Ab Screen: <0.2 AI (ref 0.0–0.9)
Chromatin Ab SerPl-aCnc: <0.2 AI (ref 0.0–0.9)
ENA RNP Ab: <0.2 AI (ref 0.0–0.9)
ENA SM Ab Ser-aCnc: <0.2 AI (ref 0.0–0.9)
ENA SSA (RO) Ab: <0.2 AI (ref 0.0–0.9)
ENA SSB (LA) Ab: <0.2 AI (ref 0.0–0.9)
Scleroderma (Scl-70) (ENA) Antibody, IgG: <0.2 AI (ref 0.0–0.9)
dsDNA Ab: <1 [IU]/mL (ref 0–9)

## 2023-03-31 LAB — C-REACTIVE PROTEIN: CRP: 2 mg/L (ref 0–10)

## 2023-03-31 LAB — FANA STAINING PATTERNS
Homogeneous Pattern: 1:160 {titer} — ABNORMAL HIGH
Speckled Pattern: 1:160 {titer} — ABNORMAL HIGH

## 2023-03-31 LAB — VITAMIN B1: Thiamine: 111.4 nmol/L (ref 66.5–200.0)

## 2023-03-31 LAB — B12 AND FOLATE PANEL
Folate: 6.4 ng/mL (ref >3.0)
Vitamin B-12: 395 pg/mL (ref 232–1245)

## 2023-03-31 LAB — VITAMIN D 25 HYDROXY (VIT D DEFICIENCY, FRACTURES): Vit D, 25-Hydroxy: 23.9 ng/mL — ABNORMAL LOW (ref 30.0–100.0)

## 2023-03-31 LAB — METHYLMALONIC ACID, SERUM: Methylmalonic Acid: 305 nmol/L (ref 0–378)

## 2023-03-31 LAB — ANGIOTENSIN CONVERTING ENZYME: Angio Convert Enzyme: 54 U/L (ref 14–82)

## 2023-03-31 LAB — VITAMIN B6: Vitamin B6: 6.8 ug/L (ref 3.4–65.2)

## 2023-03-31 LAB — ANTINUCLEAR ANTIBODIES, IFA: ANA Titer 1: POSITIVE — AB

## 2023-03-31 LAB — SEDIMENTATION RATE: Sed Rate: 4 mm/h (ref 0–32)

## 2023-03-31 NOTE — Telephone Encounter (Signed)
Pt has been updated regarding Last lab testing awaiting for blood abnomralities IFE/multiple myeloma panel negative. She questioned if she would get worked in sooner for EMG, I did send MD a message to clarify.

## 2023-04-05 NOTE — Telephone Encounter (Signed)
Pt's NCV/EMG appt has been moved up to 05/11/23 at 9:30am

## 2023-04-08 ENCOUNTER — Ambulatory Visit (INDEPENDENT_AMBULATORY_CARE_PROVIDER_SITE_OTHER): Payer: No Typology Code available for payment source | Admitting: Neurology

## 2023-04-08 ENCOUNTER — Ambulatory Visit (INDEPENDENT_AMBULATORY_CARE_PROVIDER_SITE_OTHER): Payer: Self-pay | Admitting: Neurology

## 2023-04-08 DIAGNOSIS — M79601 Pain in right arm: Secondary | ICD-10-CM | POA: Diagnosis not present

## 2023-04-08 DIAGNOSIS — R202 Paresthesia of skin: Secondary | ICD-10-CM

## 2023-04-08 DIAGNOSIS — M79602 Pain in left arm: Secondary | ICD-10-CM

## 2023-04-08 DIAGNOSIS — M6281 Muscle weakness (generalized): Secondary | ICD-10-CM

## 2023-04-08 DIAGNOSIS — Z0289 Encounter for other administrative examinations: Secondary | ICD-10-CM

## 2023-04-08 NOTE — Patient Instructions (Addendum)
Vitamin D low, discuss with Rheumatology B12 normal 395 but we usually want it above 400. MMA was high normal wouldn;t hurt to try some B12 oral 1000 daily for a few months Waiting on MRI brain and c-spine  Vitamin B12 Deficiency Vitamin B12 deficiency occurs when the body does not have enough of this important vitamin. The body needs this vitamin: To make red blood cells. To make DNA. This is the genetic material inside cells. To help the nerves work properly so they can carry messages from the brain to the body. Vitamin B12 deficiency can cause health problems, such as not having enough red blood cells in the blood (anemia). This can lead to nerve damage if untreated. What are the causes? This condition may be caused by: Not eating enough foods that contain vitamin B12. Not having enough stomach acid and digestive fluids to properly absorb vitamin B12 from the food that you eat. Having certain diseases that make it hard to absorb vitamin B12. These diseases include Crohn's disease, chronic pancreatitis, and cystic fibrosis. An autoimmune disorder in which the body does not make enough of a protein (intrinsic factor) within the stomach, resulting in not enough absorption of vitamin B12. Having a surgery in which part of the stomach or small intestine is removed. Taking certain medicines that make it hard for the body to absorb vitamin B12. These include: Heartburn medicines, such as antacids and proton pump inhibitors. Some medicines that are used to treat diabetes. What increases the risk? The following factors may make you more likely to develop a vitamin B12 deficiency: Being an older adult. Eating a vegetarian or vegan diet that does not include any foods that come from animals. Eating a poor diet while you are pregnant. Taking certain medicines. Having alcoholism. What are the signs or symptoms? In some cases, there are no symptoms of this condition. If the condition leads to anemia  or nerve damage, various symptoms may occur, such as: Weakness. Tiredness (fatigue). Loss of appetite. Numbness or tingling in your hands and feet. Redness and burning of the tongue. Depression, confusion, or memory problems. Trouble walking. If anemia is severe, symptoms can include: Shortness of breath. Dizziness. Rapid heart rate. How is this diagnosed? This condition may be diagnosed with a blood test to measure the level of vitamin B12 in your blood. You may also have other tests, including: A group of tests that measure certain characteristics of blood cells (complete blood count, CBC). A blood test to measure intrinsic factor. A procedure where a thin tube with a camera on the end is used to look into your stomach or intestines (endoscopy). Other tests may be needed to discover the cause of the deficiency. How is this treated? Treatment for this condition depends on the cause. This condition may be treated by: Changing your eating and drinking habits, such as: Eating more foods that contain vitamin B12. Drinking less alcohol or no alcohol. Getting vitamin B12 injections. Taking vitamin B12 supplements by mouth (orally). Your health care provider will tell you which dose is best for you. Follow these instructions at home: Eating and drinking  Include foods in your diet that come from animals and contain a lot of vitamin B12. These include: Meats and poultry. This includes beef, pork, chicken, [place], and organ meats, such as liver. Seafood. This includes clams, rainbow trout, salmon, tuna, and haddock. Eggs. Dairy foods such as milk, yogurt, and cheese. Eat foods that have vitamin B12 added to them (are fortified), such  as ready-to-eat breakfast cereals. Check the label on the package to see if a food is fortified. The items listed above may not be a complete list of foods and beverages you can eat and drink. Contact a dietitian for more information. Alcohol use Do not drink  alcohol if: Your health care provider tells you not to drink. You are pregnant, may be pregnant, or are planning to become pregnant. If you drink alcohol: Limit how much you have to: 0-1 drink a day for women. 0-2 drinks a day for men. Know how much alcohol is in your drink. In the [place] one drink equals one 12 oz bottle of beer (355 mL), one 5 oz glass of wine (148 mL), or one 1 oz glass of hard liquor (44 mL). General instructions Get vitamin B12 injections if told to by your health care provider. Take supplements only as told by your health care provider. Follow the directions carefully. Keep all follow-up visits. This is important. Contact a health care provider if: Your symptoms come back. Your symptoms get worse or do not improve with treatment. Get help right away: You develop shortness of breath. You have a rapid heart rate. You have chest pain. You become dizzy or you faint. These symptoms may be an emergency. Get help right away. Call 911. Do not wait to see if the symptoms will go away. Do not drive yourself to the hospital. Summary Vitamin B12 deficiency occurs when the body does not have enough of this important vitamin. Common causes include not eating enough foods that contain vitamin B12, not being able to absorb vitamin B12 from the food that you eat, having a surgery in which part of the stomach or small intestine is removed, or taking certain medicines. Eat foods that have vitamin B12 in them. Treatment may include making a change in the way you eat and drink, getting vitamin B12 injections, or taking vitamin B12 supplements. This information is not intended to replace advice given to you by your health care provider. Make sure you discuss any questions you have with your health care provider. Document Revised: 12/13/2020 Document Reviewed: 12/13/2020 Elsevier Patient Education  2024 [employer].

## 2023-04-08 NOTE — Telephone Encounter (Signed)
Cervical MRI also sent to GI

## 2023-04-08 NOTE — Progress Notes (Signed)
Full Name: Sherry Johnston Gender: Female MRN #: [id number] Date of Birth: 03-31-1976    Visit Date: 04/08/2023 08:21 Age: 47 Years Examining Physician: Dr. Naomie Dean Referring Physician: Dr. Naomie Dean Height: 5 feet 4 inch  History: Symptoms all started on 10/18 with acute onset numbness and tingling in the hands and feet acutely, comes and goes. Halloween night had burning starting in the feet patches that move around not in one distribution, she feels sunburned in the low back. 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  Given extensive workup without etiology MRI of the brain and MRI of the cervical spine with and without contrast ordered and pending to evaluate for demyelination or other etiology.    Summary: EMG and nerve conduction studies were performed on the left upper and lower extremities.All nerves and muscles (as indicated in the following tables) were within normal limits.      Conclusion: EMG and nerve conduction studies were performed on the left upper and lower extremities.All nerves and muscles (as indicated in the following tables) were within normal limits.   ------------------------------- Naomie Dean, M.D.  [facility] [address] [place], [place] [address] Tel: [phone] Fax: [phone]  Verbal informed consent was obtained from the patient, patient was informed of potential risk of procedure, including bruising, bleeding, hematoma formation, infection, muscle weakness, muscle pain, numbness, among others.        MNC    Nerve / Sites Muscle Latency Ref. Amplitude Ref. Rel Amp Segments Distance Velocity Ref. Area    ms ms mV mV %  cm m/s m/s mVms  L Median - APB     Wrist APB 3.3 <=4.4 10.3 >=4.0 100 Wrist - APB 7   35.7     Upper arm  APB 6.9  10.2  98.7 Upper arm - Wrist 24.6 67 >=49 43.1  L Ulnar - ADM     Wrist ADM 2.8 <=3.3 8.7 >=6.0 100 Wrist - ADM 7   42.7     B.Elbow ADM 4.8  8.8  101 B.Elbow - Wrist 13 63 >=49 42.6     A.Elbow ADM 7.0  9.0  103 A.Elbow - B.Elbow 14 65 >=49 42.5  L Peroneal - EDB     Ankle EDB 4.3 <=6.5 8.1 >=2.0 100 Ankle - EDB 9   26.0     Fib head EDB 9.6  7.2  88.1 Fib head - Ankle 27 51 >=44 25.3     Pop fossa EDB 11.6  7.7  108 Pop fossa - Fib head 9 45 >=44 26.3         Pop fossa - Ankle      L Tibial - AH     Ankle AH 4.1 <=5.8 11.8 >=4.0 100 Ankle - AH 9   29.9     Pop fossa AH 11.8  8.0  67.9 Pop fossa - Ankle 36 47 >=41 24.1             SNC    Nerve / Sites Rec. Site Peak Lat Ref.  Amp Ref. Segments Distance Peak Diff Ref.    ms ms V V  cm ms ms  L Sural - Ankle (Calf)     Calf Ankle 3.9 <=4.4 26 >=  6 Calf - Ankle 14    L Superficial peroneal - Ankle     Lat leg Ankle 3.7 <=4.4 15 >=6 Lat leg - Ankle 14    L Median, Ulnar - Transcarpal comparison     Median Palm Wrist 2.1 <=2.2 160 >=35 Median Palm - Wrist 8       Ulnar Palm Wrist 2.1 <=2.2 89 >=12 Ulnar Palm - Wrist 8          Median Palm - Ulnar Palm  0.0 <=0.4  L Median - Orthodromic (Dig II, Mid palm)     Dig II Wrist 3.3 <=3.4 45 >=10 Dig II - Wrist 13    L Ulnar - Orthodromic, (Dig V, Mid palm)     Dig V Wrist 3.0 <=3.1 16 >=5 Dig V - Wrist 74                 F  Wave    Nerve F Lat Ref.   ms ms  L Tibial - AH 47.1 <=56.0  L Ulnar - ADM 24.8 <=32.0         H Reflex    Nerve H Lat Lat Hmax   ms ms   Left Right Ref. Left Right Ref.  Tibial - Soleus 34.4 33.1 <=35.0 30.5 31.7 <=35.0         EMG Summary Table    Spontaneous MUAP Recruitment  Muscle IA Fib PSW Fasc Other Amp Dur. Poly Pattern  L. Cervical paraspinals (low) Normal None None None _______ Normal Normal Normal Normal  L. Deltoid Normal None None None _______ Normal Normal Normal Normal  L. Triceps brachii Normal None None None _______ Normal Normal  Normal Normal  L. Pronator teres Normal None None None _______ Normal Normal Normal Normal  L. Opponens pollicis Normal None None None _______ Normal Normal Normal Normal  L. First dorsal interosseous Normal None None None _______ Normal Normal Normal Normal  L. Lumbar paraspinals (low) Normal None None None _______ Normal Normal Normal Normal  L. Iliopsoas Normal None None None _______ Normal Normal Normal Normal  L. Vastus medialis Normal None None None _______ Normal Normal Normal Normal  L. Tibialis anterior Normal None None None _______ Normal Normal Normal Normal  L. Gastrocnemius (Medial head) Normal None None None _______ Normal Normal Normal Normal  L. Extensor hallucis longus Normal None None None _______ Normal Normal Normal Normal  L. Abductor hallucis Normal None None None _______ Normal Normal Normal Normal  L. Abductor digiti minimi (pedis) Normal None None None _______ Normal Normal Normal Normal  L. Biceps femoris (long head) Normal None None None _______ Normal Normal Normal Normal  L. Gluteus maximus Normal None None None _______ Normal Normal Normal Normal  L. Gluteus medius Normal None None None _______ Normal Normal Normal Normal

## 2023-04-08 NOTE — Addendum Note (Signed)
Addended by: Naomie Dean B on: 04/08/2023 10:00 AM   Modules accepted: Orders

## 2023-04-10 NOTE — Procedures (Signed)
Full Name: Sherry Johnston Gender: Female MRN #: [id number] Date of Birth: 1975-06-24    Visit Date: 04/08/2023 08:21 Age: 47 Years Examining Physician: Dr. Naomie Dean Referring Physician: Dr. Naomie Dean Height: 5 feet 4 inch  History: Symptoms all started on 10/18 with acute onset numbness and tingling in the hands and feet acutely, comes and goes. Halloween night had burning starting in the feet patches that move around not in one distribution, she feels sunburned in the low back. 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  Given extensive workup without etiology MRI of the brain and MRI of the cervical spine with and without contrast recommended to evaluate for demyelination or other etiology.  Summary: EMG and nerve conduction studies were performed on the left upper and lower extremities.All nerves and muscles (as indicated in the following tables) were within normal limits.      Conclusion: EMG and nerve conduction studies were performed on the left upper and lower extremities.All nerves and muscles (as indicated in the following tables) were within normal limits.    ------------------------------- Naomie Dean, M.D.  [facility] [address] [place], [place] [address] Tel: [phone] Fax: [phone]  Verbal informed consent was obtained from the patient, patient was informed of potential risk of procedure, including bruising, bleeding, hematoma formation, infection, muscle weakness, muscle pain, numbness, among others.        MNC    Nerve / Sites Muscle Latency Ref. Amplitude Ref. Rel Amp Segments Distance Velocity Ref. Area    ms ms mV mV %  cm m/s m/s mVms  L Median - APB     Wrist APB 3.3 <=4.4 10.3 >=4.0 100 Wrist - APB 7   35.7     Upper arm APB 6.9   10.2  98.7 Upper arm - Wrist 24.6 67 >=49 43.1  L Ulnar - ADM     Wrist ADM 2.8 <=3.3 8.7 >=6.0 100 Wrist - ADM 7   42.7     B.Elbow ADM 4.8  8.8  101 B.Elbow - Wrist 13 63 >=49 42.6     A.Elbow ADM 7.0  9.0  103 A.Elbow - B.Elbow 14 65 >=49 42.5  L Peroneal - EDB     Ankle EDB 4.3 <=6.5 8.1 >=2.0 100 Ankle - EDB 9   26.0     Fib head EDB 9.6  7.2  88.1 Fib head - Ankle 27 51 >=44 25.3     Pop fossa EDB 11.6  7.7  108 Pop fossa - Fib head 9 45 >=44 26.3         Pop fossa - Ankle      L Tibial - AH     Ankle AH 4.1 <=5.8 11.8 >=4.0 100 Ankle - AH 9   29.9     Pop fossa AH 11.8  8.0  67.9 Pop fossa - Ankle 36 47 >=41 24.1             SNC    Nerve / Sites Rec. Site Peak Lat Ref.  Amp Ref. Segments Distance Peak Diff Ref.    ms ms V V  cm ms ms  L Sural - Ankle (Calf)     Calf Ankle 3.9 <=4.4 26 >=6 Calf -  Ankle 14    L Superficial peroneal - Ankle     Lat leg Ankle 3.7 <=4.4 15 >=6 Lat leg - Ankle 14    L Median, Ulnar - Transcarpal comparison     Median Palm Wrist 2.1 <=2.2 160 >=35 Median Palm - Wrist 8       Ulnar Palm Wrist 2.1 <=2.2 89 >=12 Ulnar Palm - Wrist 8          Median Palm - Ulnar Palm  0.0 <=0.4  L Median - Orthodromic (Dig II, Mid palm)     Dig II Wrist 3.3 <=3.4 45 >=10 Dig II - Wrist 13    L Ulnar - Orthodromic, (Dig V, Mid palm)     Dig V Wrist 3.0 <=3.1 16 >=5 Dig V - Wrist 44                 F  Wave    Nerve F Lat Ref.   ms ms  L Tibial - AH 47.1 <=56.0  L Ulnar - ADM 24.8 <=32.0         H Reflex    Nerve H Lat Lat Hmax   ms ms   Left Right Ref. Left Right Ref.  Tibial - Soleus 34.4 33.1 <=35.0 30.5 31.7 <=35.0         EMG Summary Table    Spontaneous MUAP Recruitment  Muscle IA Fib PSW Fasc Other Amp Dur. Poly Pattern  L. Cervical paraspinals (low) Normal None None None _______ Normal Normal Normal Normal  L. Deltoid Normal None None None _______ Normal Normal Normal Normal  L. Triceps brachii Normal None None None _______ Normal Normal Normal  Normal  L. Pronator teres Normal None None None _______ Normal Normal Normal Normal  L. Opponens pollicis Normal None None None _______ Normal Normal Normal Normal  L. First dorsal interosseous Normal None None None _______ Normal Normal Normal Normal  L. Lumbar paraspinals (low) Normal None None None _______ Normal Normal Normal Normal  L. Iliopsoas Normal None None None _______ Normal Normal Normal Normal  L. Vastus medialis Normal None None None _______ Normal Normal Normal Normal  L. Tibialis anterior Normal None None None _______ Normal Normal Normal Normal  L. Gastrocnemius (Medial head) Normal None None None _______ Normal Normal Normal Normal  L. Extensor hallucis longus Normal None None None _______ Normal Normal Normal Normal  L. Abductor hallucis Normal None None None _______ Normal Normal Normal Normal  L. Abductor digiti minimi (pedis) Normal None None None _______ Normal Normal Normal Normal  L. Biceps femoris (long head) Normal None None None _______ Normal Normal Normal Normal  L. Gluteus maximus Normal None None None _______ Normal Normal Normal Normal  L. Gluteus medius Normal None None None _______ Normal Normal Normal Normal

## 2023-04-14 ENCOUNTER — Telehealth: Payer: No Typology Code available for payment source | Admitting: Neurology

## 2023-04-16 ENCOUNTER — Ambulatory Visit
Admission: RE | Admit: 2023-04-16 | Discharge: 2023-04-16 | Disposition: A | Discharge status: Home / Self Care | Payer: No Typology Code available for payment source | Source: Ambulatory Visit | Attending: Neurology | Admitting: Neurology

## 2023-04-16 DIAGNOSIS — H5460 Unqualified visual loss, one eye, unspecified: Secondary | ICD-10-CM

## 2023-04-16 DIAGNOSIS — G629 Polyneuropathy, unspecified: Secondary | ICD-10-CM

## 2023-04-16 DIAGNOSIS — R61 Generalized hyperhidrosis: Secondary | ICD-10-CM

## 2023-04-16 DIAGNOSIS — H539 Unspecified visual disturbance: Secondary | ICD-10-CM

## 2023-04-16 DIAGNOSIS — R42 Dizziness and giddiness: Secondary | ICD-10-CM

## 2023-04-16 DIAGNOSIS — R200 Anesthesia of skin: Secondary | ICD-10-CM

## 2023-04-16 DIAGNOSIS — C7951 Secondary malignant neoplasm of bone: Secondary | ICD-10-CM

## 2023-04-16 DIAGNOSIS — G959 Disease of spinal cord, unspecified: Secondary | ICD-10-CM

## 2023-04-16 DIAGNOSIS — D8989 Other specified disorders involving the immune mechanism, not elsewhere classified: Secondary | ICD-10-CM

## 2023-04-16 MED ORDER — GADOPICLENOL 0.5 MMOL/ML IV SOLN
7.5000 mL | Freq: Once | INTRAVENOUS | Status: AC | PRN
  Administered 2023-04-16: 7.5 mL via INTRAVENOUS

## 2023-04-16 MED ORDER — IOPAMIDOL (ISOVUE-300) INJECTION 61%
100.0000 mL | Freq: Once | INTRAVENOUS | Status: AC | PRN
  Administered 2023-04-16: 100 mL via INTRAVENOUS

## 2023-04-18 ENCOUNTER — Encounter: Payer: Self-pay | Admitting: Neurology

## 2023-04-19 NOTE — Progress Notes (Signed)
See procedure note.

## 2023-05-04 ENCOUNTER — Encounter: Payer: Self-pay | Admitting: Neurology

## 2023-05-06 ENCOUNTER — Other Ambulatory Visit (HOSPITAL_COMMUNITY): Payer: Self-pay

## 2023-05-10 ENCOUNTER — Ambulatory Visit
Admission: RE | Admit: 2023-05-10 | Discharge: 2023-05-10 | Disposition: A | Discharge status: Home / Self Care | Payer: No Typology Code available for payment source | Source: Ambulatory Visit | Attending: Neurology | Admitting: Neurology

## 2023-05-10 ENCOUNTER — Other Ambulatory Visit: Payer: No Typology Code available for payment source

## 2023-05-10 DIAGNOSIS — R61 Generalized hyperhidrosis: Secondary | ICD-10-CM

## 2023-05-10 DIAGNOSIS — C7951 Secondary malignant neoplasm of bone: Secondary | ICD-10-CM

## 2023-05-10 DIAGNOSIS — G959 Disease of spinal cord, unspecified: Secondary | ICD-10-CM

## 2023-05-10 DIAGNOSIS — R270 Ataxia, unspecified: Secondary | ICD-10-CM

## 2023-05-10 DIAGNOSIS — R29898 Other symptoms and signs involving the musculoskeletal system: Secondary | ICD-10-CM

## 2023-05-10 DIAGNOSIS — R200 Anesthesia of skin: Secondary | ICD-10-CM | POA: Diagnosis not present

## 2023-05-10 DIAGNOSIS — G629 Polyneuropathy, unspecified: Secondary | ICD-10-CM

## 2023-05-10 DIAGNOSIS — R42 Dizziness and giddiness: Secondary | ICD-10-CM

## 2023-05-10 MED ORDER — GADOPICLENOL 0.5 MMOL/ML IV SOLN
7.5000 mL | Freq: Once | INTRAVENOUS | Status: AC | PRN
  Administered 2023-05-10: 7.5 mL via INTRAVENOUS

## 2023-05-11 ENCOUNTER — Encounter: Payer: No Typology Code available for payment source | Admitting: Neurology

## 2023-05-12 ENCOUNTER — Encounter: Payer: Self-pay | Admitting: Neurology

## 2023-05-13 ENCOUNTER — Telehealth: Payer: Self-pay | Admitting: Neurology

## 2023-05-13 NOTE — Telephone Encounter (Signed)
 Can you schedule a follow up video not urgent please. She will want sooner but we just don;t have availability so first avaialbe and she has been extensively tested, please apologize if she is unhappy about a wait. thanks

## 2023-05-13 NOTE — Telephone Encounter (Signed)
 Pt scheduled for video visit with Dr. Lucia Gaskins for 09/20/23 at 8:30am and appt added to wait list

## 2023-05-20 ENCOUNTER — Other Ambulatory Visit (HOSPITAL_COMMUNITY): Payer: Self-pay

## 2023-05-20 MED ORDER — LISDEXAMFETAMINE DIMESYLATE 40 MG PO CAPS
40.0000 mg | ORAL_CAPSULE | Freq: Every day | ORAL | 0 refills | Status: DC
  Filled 2023-05-20: qty 30, 30d supply, fill #0

## 2023-05-24 ENCOUNTER — Encounter: Payer: No Typology Code available for payment source | Admitting: Neurology

## 2023-06-08 ENCOUNTER — Ambulatory Visit (INDEPENDENT_AMBULATORY_CARE_PROVIDER_SITE_OTHER): Payer: No Typology Code available for payment source | Admitting: Neurology

## 2023-06-08 ENCOUNTER — Encounter: Payer: Self-pay | Admitting: Neurology

## 2023-06-08 VITALS — BP 133/85 | HR 98 | Ht 63.0 in | Wt 173.0 lb

## 2023-06-08 DIAGNOSIS — R21 Rash and other nonspecific skin eruption: Secondary | ICD-10-CM | POA: Diagnosis not present

## 2023-06-08 DIAGNOSIS — M2550 Pain in unspecified joint: Secondary | ICD-10-CM

## 2023-06-08 DIAGNOSIS — M2540 Effusion, unspecified joint: Secondary | ICD-10-CM | POA: Diagnosis not present

## 2023-06-08 DIAGNOSIS — G43709 Chronic migraine without aura, not intractable, without status migrainosus: Secondary | ICD-10-CM | POA: Diagnosis not present

## 2023-06-08 MED ORDER — ELETRIPTAN HYDROBROMIDE 40 MG PO TABS: 40.0000 mg | ORAL_TABLET | ORAL | 11 refills | Status: DC | PRN

## 2023-06-08 NOTE — Progress Notes (Signed)
 [facility]    Provider:  Dr Ines Requesting Provider: Katina Pfeiffer, PA-C Primary Care Provider:  Katina Pfeiffer, PA-C  CC:  Migraine  06/08/2023: she felt better on prednisone, no primary neurologic disorder found, f/u pcp and rheumatology. Reviewed extensive workup and reviewed MRI images with patient pointing out the cervical muscles and spine and spinal cord and perineural cysts(benign) and benign small-vessel disease.   04/16/2023: MRI brain  NEUROIMAGING REPORT     STUDY DATE: 04/16/2023 PATIENT NAME: Sherry Johnston DOB: [birth year] MRN: [id number]   ORDERING CLINICIAN: Dr Ines CLINICAL HISTORY: 48 year old lady being evaluated for monocular vision loss and numbness COMPARISON FILMS: None EXAM: MRI brain with and without contrast TECHNIQUE: Sagittal T1, axial T1, T2, FLAIR, DWI, T2*, coronal T2 and postcontrast coronal axial and  images were obtained through the brain CONTRAST: 7.5 mL IV gadopiclenol  IMAGING SITE: [facility] imaging   FINDINGS:  The brain parenchyma shows periventricular and left frontal subcortical nonspecific T2/FLAIR white matter hyperintensities which can be seen in a variety of conditions including small vessel disease, migraine, remote trauma, small vessel disease, demyelinating, autoimmune or infectious conditions.  None of these involve the corpus callosum, brainstem or cerebellum or show any postcontrast enhancement.  No other structural lesion, tumor or infarct is noted.  Diffusion-weighted imaging negative for acute ischemia.  T2 star images do not show any microhemorrhages.  Subarachnoid spaces and ventricular system appear normal.  Extra-axial brain structures appear normal.  Calvarium shows no abnormalities.  Orbits appear unremarkable.  Paranasal sinuses show only minor mucosal thickening.  The pituitary gland and cerebellar tonsils appear normal.  Visualized portion of the cervical spine shows no significant  abnormalities.  Flow-voids of large vessels of intracranial circulation appear to be patent.  Postcontrast images did not result in abnormal areas of enhancement.         IMPRESSION: MRI scan of the brain with and without contrast showing nonspecific periventricular and left frontal subcortical T2/FLAIR white matter hyperintensities with the differential discussed above.  No enhancing lesions are noted.    05/10/2023: [facility]RI cervical spine: EXAM: MRI of the cervical spine with and without contrast   ORDERING CLINICIAN: Onetha Ines, MD CLINICAL HISTORY: 48 year old woman with numbness and gait disturbance COMPARISON FILMS: CT 02/22/2023   TECHNIQUE: MRI of the cervical spine was obtained utilizing 3 mm sagittal slices from the posterior fossa down to the T3-4 level with T1, T2 and inversion recovery views. In addition 4 mm axial slices from C2-3 down to T1-2 level were included with T2 and gradient echo views.  After the infusion contrast, additional T1-weighted images were performed. CONTRAST: 7.5 mL Vueway  IMAGING SITE: [facility] [place], [place]    FINDINGS: :  On sagittal images, the spine is imaged from above the cervicomedullary junction to T2.  The visible brain and the cervicomedullary junction appears normal.  Paravertebral soft tissue appears normal.  The spinal cord is of normal caliber and signal.   The vertebral bodies are normally aligned.   The vertebral bodies have normal signal.  Perineural cysts are noted to the right at C6-C7, bilaterally at C7-T1 and left greater than right at T1-T2.   The discs and interspaces were further evaluated on axial views from C2 to T1 as follows: C2 - C3:  The disc and interspace appear normal. C3 - C4:  The disc and interspace appear normal. C4 - C5:  The disc and interspace appear normal. C5 - C6: There is minimal disc bulging but no  foraminal narrowing, spinal stenosis or nerve root compression.. C6 - C7:  The disc and interspace  appear normal. C7 - T1:  The disc and interspace appear normal.   After the infusion contrast, a normal enhancement pattern was observed.     IMPRESSION:   This MRI of the cervical spine with and without contrast shows the following: The spinal cord appears normal. Negligible disc bulging at C5-C6 not causing spinal stenosis or nerve root compression.  This is unlikely to be clinically significant. 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  These are usually asymptomatic  Addendum 05/19/2023: Neurologic workup unrevealing. MRI of the cervical spine with  Incidental Benign perineural cysts (discussed with patient). CT ABD/Pelvis unremarkable. MRI of the brain unremarkable.  Blood work included negative paraneoplastic antibodies, ANA 1-1 60 speckled pattern, ANCA normal, decreased vitamin D  23.9, normal CRP, ANA comprehensive panel negative, sed rate normal, ACE normal, IFE SPEP normal, B12/folate/methylmalonic acid normal,normal b6 and b1. EMG/NCS normal. No neurologic etiology found.  03/24/2023: She stopped the zonegran . Her life went hay wire. She started getting breakthrough migraines. She has not had a migraine, the zonegran  and the qulipta  together helps. She tried stopping the zonegran  and had some breakthrough and she added back the zonegran  and the combination is magic, she hasn;t even had to use her relpax . She has purply mottly rash both arms, both legs, itchy, hives, sun sensitive, BUE/BLE numbness and tingling and neck mass. All started on 10/18 and acute onset numbness and tingling in the hands and feet acutely and arms, comes and goes. Halloween night had burning starting in the feet patches that move around not in one distribution she feels sunburned in the low back. But no raiation from the low back into the legs, the neuropathy starts in the feet and travels upwards and can be random like in a pattern of clouds which is no pattern, not in a dermatomal  pattern either. Dermayology felt it and believes it is very deep or wouldn;t biopsy it didn;t feel it was dermatologic. Around c2/c3 feels like on the protruding vertebra hard but mobilem doesn't feel like its in the dermis or subc tissue feels like it is more bony. Fatigue. Brain fog. Extreme fatigue. Rash. She is ANA positive and joint pain, gets dizzy when pushing down on the lesion, feels like a 3mm round sphere. Dizziness. Night sweats. Vision changes blurry/diplopia, worse in the right eye - monocular vision loss. No other focal neurologic deficits, associated symptoms, inciting events or modifiable factors.  Patient complains of symptoms per HPI as well as the following symptoms: none . Pertinent negatives and positives per HPI. All others negative   Reviewed outside images and labs:  CBC    Component Value Date/Time   WBC 7.9 02/22/2023 1119   RBC 4.86 02/22/2023 1119   HGB 14.7 02/22/2023 1119   HCT 42.0 02/22/2023 1119   PLT 296 02/22/2023 1119   MCV 86.4 02/22/2023 1119   MCH 30.2 02/22/2023 1119   MCHC 35.0 02/22/2023 1119   RDW 12.3 02/22/2023 1119   LYMPHSABS 1.8 02/22/2023 1119   MONOABS 0.4 02/22/2023 1119   EOSABS 0.1 02/22/2023 1119   BASOSABS 0.1 02/22/2023 1119      Latest Ref Rng & Units 03/24/2023   11:06 AM 02/22/2023   11:19 AM 02/03/2012   12:40 PM  CMP  Glucose 70 - 99 mg/dL  891  80   BUN 6 - 20 mg/dL  13  Creatinine 0.44 - 1.00 mg/dL  9.09    Sodium 864 - 854 mmol/L  139    Potassium 3.5 - 5.1 mmol/L  3.9    Chloride 98 - 111 mmol/L  103    CO2 22 - 32 mmol/L  30    Calcium 8.9 - 10.3 mg/dL  9.8    Total Protein 6.0 - 8.5 g/dL 6.8        Recent Results (from the past 2160 hours)  Vitamin B1     Status: None   Collection Time: 03/24/23 11:06 AM  Result Value Ref Range   Thiamine 111.4 66.5 - 200.0 nmol/L  Vitamin B6     Status: None   Collection Time: 03/24/23 11:06 AM  Result Value Ref Range   Vitamin B6 6.8 3.4 - 65.2 ug/L    Comment:                               Deficiency:         <3.4                              Marginal:      3.4 - 5.1                              Adequate:           >5.1   B12 and Folate Panel     Status: None   Collection Time: 03/24/23 11:06 AM  Result Value Ref Range   Vitamin B-12 395 232 - 1,245 pg/mL   Folate 6.4 >3.0 ng/mL    Comment: A serum folate concentration of less than 3.1 ng/mL is considered to represent clinical deficiency.   Methylmalonic acid, serum     Status: None   Collection Time: 03/24/23 11:06 AM  Result Value Ref Range   Methylmalonic Acid 305 0 - 378 nmol/L  Multiple Myeloma Panel (SPEP&IFE w/QIG)     Status: None   Collection Time: 03/24/23 11:06 AM  Result Value Ref Range   IgG (Immunoglobin G), Serum 1,082 586 - 1,602 mg/dL   IgA/Immunoglobulin A, Serum 178 87 - 352 mg/dL   IgM (Immunoglobulin M), Srm 109 26 - 217 mg/dL   Total Protein 6.8 6.0 - 8.5 g/dL   Albumin SerPl Elph-Mcnc 3.9 2.9 - 4.4 g/dL   Alpha 1 0.2 0.0 - 0.4 g/dL   Alpha2 Glob SerPl Elph-Mcnc 0.6 0.4 - 1.0 g/dL   B-Globulin SerPl Elph-Mcnc 1.0 0.7 - 1.3 g/dL   Gamma Glob SerPl Elph-Mcnc 1.0 0.4 - 1.8 g/dL   M Protein SerPl Elph-Mcnc Not Observed Not Observed g/dL   Globulin, Total 2.9 2.2 - 3.9 g/dL   Albumin/Glob SerPl 1.4 0.7 - 1.7   IFE 1 Comment     Comment: The immunofixation pattern appears unremarkable. Evidence of monoclonal protein is not apparent.    Please Note Comment     Comment: Protein electrophoresis scan will follow via computer, mail, or courier delivery.   Angiotensin converting enzyme     Status: None   Collection Time: 03/24/23 11:06 AM  Result Value Ref Range   Angio Convert Enzyme 54 14 - 82 U/L  Sedimentation rate     Status: None   Collection Time: 03/24/23 11:06 AM  Result Value Ref Range   Sed Rate 4  0 - 32 mm/hr  ANA Comprehensive Panel     Status: None   Collection Time: 03/24/23 11:06 AM  Result Value Ref Range   dsDNA Ab <1 0 - 9 IU/mL    Comment:                                     Negative      <5                                    Equivocal  5 - 9                                    Positive      >9    ENA RNP Ab <0.2 0.0 - 0.9 AI   ENA SM Ab Ser-aCnc <0.2 0.0 - 0.9 AI   Scleroderma (Scl-70) (ENA) Antibody, IgG <0.2 0.0 - 0.9 AI   ENA SSA (RO) Ab <0.2 0.0 - 0.9 AI   ENA SSB (LA) Ab <0.2 0.0 - 0.9 AI   Chromatin Ab SerPl-aCnc <0.2 0.0 - 0.9 AI   Anti JO-1 <0.2 0.0 - 0.9 AI   Centromere Ab Screen <0.2 0.0 - 0.9 AI   See below: Comment     Comment: Autoantibody                       Disease Association ------------------------------------------------------------                         Condition                  Frequency ---------------------   ------------------------   --------- Antinuclear Antibody,    SLE, mixed connective Direct (ANA-D)           tissue diseases ---------------------   ------------------------   --------- dsDNA                    SLE                        40 - 60% ---------------------   ------------------------   --------- Chromatin                Drug induced SLE                90%                          SLE                        48 - 97% ---------------------   ------------------------   --------- SSA (Ro)                 SLE                        25 - 35%                          Sjogren's Syndrome         40 - 70%  Neonatal Lupus                 100% ---------------------   ------------------------   --------- SSB (La)                 SLE                              10%                          Sjogren's Syndrome              30% ---------------------   -----------------------    --------- Sm (anti-Smith)          SLE                        15 - 30% ---------------------   -----------------------    --------- RNP                      Mixed Connective Tissue                          Disease                         95% (U1 nRNP,                SLE                        30 -  50% anti-ribonucleoprotein)  Polymyositis and/or                          Dermatomyositis                 20% ---------------------   ------------------------   --------- Scl-70 (antiDNA          Scleroderma (diffuse)      20 - 35% topoisomerase)           Crest                           13% ---------------------   ------------------------   --------- Jo-1                     Polymyositis and/or                          Dermatomyositis            20 - 40% ---------------------   ------------------------   --------- Centromere B             Scleroderma -  Crest                          variant                         80%   ANA, IFA (with reflex)     Status: Abnormal   Collection Time: 03/24/23 11:06 AM  Result Value Ref Range   ANA Titer 1 Positive (A)     Comment:  Negative   <1:80                                      Borderline  1:80                                      Positive   >1:80   C-reactive protein     Status: None   Collection Time: 03/24/23 11:06 AM  Result Value Ref Range   CRP 2 0 - 10 mg/L  Vitamin D , 25-hydroxy     Status: Abnormal   Collection Time: 03/24/23 11:06 AM  Result Value Ref Range   Vit D, 25-Hydroxy 23.9 (L) 30.0 - 100.0 ng/mL    Comment: Vitamin D  deficiency has been defined by the Institute of Medicine and an Endocrine Society practice guideline as a level of serum 25-OH vitamin D  less than 20 ng/mL (1,2). The Endocrine Society went on to further define vitamin D  insufficiency as a level between 21 and 29 ng/mL (2). 1. IOM (Institute of Medicine). 2010. Dietary reference    intakes for calcium and D. [place]: The    [employer]. 2. Holick MF, Binkley [place], Bischoff-Ferrari HA, et al.    Evaluation, treatment, and prevention of vitamin D     deficiency: an Endocrine Society clinical practice    guideline. JCEM. 2011 Jul; 96(7):1911-30.   ANCA Profile     Status: None   Collection Time: 03/24/23  11:06 AM  Result Value Ref Range   Anti-MPO Antibodies <0.2 0.0 - 0.9 units   Anti-PR3 Antibodies <0.2 0.0 - 0.9 units   C-ANCA <1:20 Neg:<1:20 titer   P-ANCA <1:20 Neg:<1:20 titer    Comment: The presence of positive fluorescence exhibiting P-ANCA or C-ANCA patterns alone is not specific for the diagnosis of Wegener's Granulomatosis (WG) or microscopic polyangiitis. Decisions about treatment should not be based solely on ANCA IFA results.  The International ANCA Group Consensus recommends follow up testing of positive sera with both PR-3 and MPO-ANCA enzyme immunoassays. As many as 5% serum samples are positive only by EIA. Ref. AM J Clin Pathol 1999;111:507-513.    Atypical pANCA <1:20 Neg:<1:20 titer    Comment: The atypical pANCA pattern has been observed in a significant percentage of patients with ulcerative colitis, primary sclerosing cholangitis and autoimmune hepatitis.   FANA Staining Patterns     Status: Abnormal   Collection Time: 03/24/23 11:06 AM  Result Value Ref Range   Homogeneous Pattern 1:160 (H)     Comment: ICAP nomenclature: AC-1   Speckled Pattern 1:160 (H)     Comment: ICAP nomenclature: AC-2,4,5,29   Note: Comment     Comment: Pattern              Potential Disease Association -------------  --------------------------------------------- Homogeneous    Systemic Lupus Erythematosus, Drug Induced                Systemic Lupus Erythematosus, Chronic                Autoimmune hepatitis, Juvenile Idiopathic                Arthritis -------------  --------------------------------------------- Speckled       Sjogren Syndrome, Systemic Lupus                Erythematosus,  Subacute Cutaneous Lupus,                Neonatal Lupus, Congenital Heart Block,                Mixed Connective Tissue Disease,                Scleroderma-diffuse, Scleroderma-Autoimmune                Myositis Overlap Syndrome, Systemic Lupus                 Erythematosus-Scleroderma-Autoimmune                Myositis Overlap Syndrome, Systemic                Autoimmune Rheumatic Disease,                Undifferentiated Connective Tissue Disease -------------  --------------------------------------------- Nucleolar      Systemic [place] lerosis, Scleroderma-Autoimmune                Myositis Overlap Syndrome, Sjogren                Syndrome, Raynaud phenomenon, Pulmonary                Arterial Hypertension, Systemic Autoimmune                Rheumatic Disease, Cancer -------------  --------------------------------------------- Centromere     Scleroderma-CREST, Limited Cutaneous SSc,                Raynaud's Phenomenon, Primary Biliary                Cholangitis -------------  --------------------------------------------- Nuclear Dot    Primary Biliary Cholangitis -------------  --------------------------------------------- Nuclear        Primary Biliary Cholangitis, Autoimmune Membrane       Hepatitis/Liver disease, Systemic Autoimmune                Rheumatic Disease, Autoimmune Cytopenias,                Linear Scleroderma, Antiphospholipid Syndrome -------------  ---------------------------------------------   Anti-Hu, Anti-Ri, Anti-Yo IFA     Status: None   Collection Time: 03/24/23 11:14 AM  Result Value Ref Range   Anti-Hu Ab Negative Negative   Anti-Ri Ab Negative Negative   Anti-Yo Ab Negative Negative      MR ANGIO NECK W WO CONTRAST Final result 03/01/2023 2:22 PM    Narrative  CLINICAL DATA:  Dizziness.  Facial tingling.  EXAM: MRA NECK WITHOUT AND WITH CONTRAST  TECHNIQUE: Multiplanar and multiecho pulse sequences of the neck were obtained without and with intravenous contrast. Angiographic images of the neck were obtained using MRA technique without and with intravenous contrast. ...       Addendum  ADDENDUM REPORT: 03/10/2023 13:02   ADDENDUM: Correction to exam and technique, MRI of the neck with and  without contrast was performed.     Electronically Signed   By: Ryan Chess M.D.   On: 03/10/2023 13:02    Addended by Chess Ryan FALCON, MD on 03/10/2023  1:04 PM  ADDENDUM REPORT: 03/02/2023 12:27   ADDENDUM: CORRECTION: There was a voice recognition error in the dictation of the findings. The Lymph Nodes section of the Findings should read NO suspicious cervical lymphadenopathy.     Electronically Signed   By: Ryan Chess M.D.     Addendum 03/10/2023  1:04 PM  ADDENDUM REPORT: 03/10/2023 13:02  ADDENDUM: Correction to exam and technique, MRI of the neck with and without contrast was performed.   Electronically Signed   By: Ryan Chess M.D.   On: 03/10/2023 13:02 ...  Signed by Chess Ryan FALCON, MD on 03/10/2023  1:04 PM     Addendum 03/02/2023 12:29 PM  ADDENDUM REPORT: 03/02/2023 12:27  ADDENDUM: CORRECTION: There was a voice recognition error in the dictation of the findings. The Lymph Nodes section of the Findings should read NO suspicious cervical lymphadenopathy.   Electronically Signed   By: Ryan Chess M.D. SABRA..  Signed by Chess Ryan FALCON, MD on 03/02/2023 12:29 PM     Narrative  CLINICAL DATA:  Dizziness, facial tingling.  EXAM: MRI OF THE NECK WITH CONTRAST  TECHNIQUE: Multiplanar, multisequence MR imaging was performed following the administration of intravenous contrast.  CONTRAST:  7mL GADAVIST  GADOBUTROL  1 MMOL/ML IV SOLN ...        Study Result  Narrative & Impression  CLINICAL DATA:  Dizziness.  Facial tingling.   EXAM: MRA NECK WITHOUT AND WITH CONTRAST   TECHNIQUE: Multiplanar and multiecho pulse sequences of the neck were obtained without and with intravenous contrast. Angiographic images of the neck were obtained using MRA technique without and with intravenous contrast.   CONTRAST:  7mL GADAVIST  GADOBUTROL  1 MMOL/ML IV SOLN   COMPARISON:  Neck CT 02/22/2023.   FINDINGS: Three-vessel arch  configuration.  Arch vessel origins are patent.   No evidence of dissection, stenosis (50% or greater), or occlusion of the carotid arteries.   Codominant vertebral arteries. No evidence of dissection, stenosis (50% or greater), or occlusion.   No evidence of vascular malformation of the neck vessels.   IMPRESSION: Normal MRA of the neck.     Electronically Signed   By: Ryan Chess M.D.   On: 03/01/2023 17:05    CT soft tissue of the neck:  Narrative  CLINICAL DATA:  Neck mass, nonpulsatile.  EXAM: CT NECK WITH CONTRAST  TECHNIQUE: Multidetector CT imaging of the neck was performed using the standard protocol following the bolus administration of intravenous contrast.  ...        Study Result  Narrative & Impression  CLINICAL DATA:  Numbness along left arm and face.   EXAM: CT CERVICAL SPINE WITHOUT CONTRAST   TECHNIQUE: Multidetector CT imaging of the cervical spine was performed without intravenous contrast. Multiplanar CT image reconstructions were also generated.   RADIATION DOSE REDUCTION: This exam was performed according to the departmental dose-optimization program which includes automated exposure control, adjustment of the mA and/or kV according to patient size and/or use of iterative reconstruction technique.   COMPARISON:  None Available.   FINDINGS: Alignment: Normal.   Skull base and vertebrae: No acute fracture. Normal craniocervical junction. No suspicious bone lesions.   Soft tissues and spinal canal: No prevertebral fluid or swelling. No visible canal hematoma.   Disc levels:  No significant degenerative change.   Upper chest: No acute findings.   Other: None.   IMPRESSION: No acute fracture or traumatic malalignment of the cervical spine. No significant degenerative change.     Narrative  CLINICAL DATA:  Numbness along left arm and face.  EXAM: CT CERVICAL SPINE WITHOUT CONTRAST  TECHNIQUE: Multidetector CT  imaging of the cervical spine was performed without intravenous contrast. Multiplanar CT image reconstructions were also generated.  ...        Study Result  Narrative & Impression  CLINICAL DATA:  Neck mass, nonpulsatile.  EXAM: CT NECK WITH CONTRAST   TECHNIQUE: Multidetector CT imaging of the neck was performed using the standard protocol following the bolus administration of intravenous contrast.   RADIATION DOSE REDUCTION: This exam was performed according to the departmental dose-optimization program which includes automated exposure control, adjustment of the mA and/or kV according to patient size and/or use of iterative reconstruction technique.   CONTRAST:  75mL OMNIPAQUE  IOHEXOL  300 MG/ML  SOLN   COMPARISON:  None Available.   FINDINGS: Pharynx and larynx: Normal. No mass or swelling.   Salivary glands: No inflammation, mass, or stone.   Thyroid : Normal.   Lymph nodes: No suspicious cervical lymphadenopathy.   Vascular: Unremarkable.   Limited intracranial: Unremarkable.   Visualized orbits: Unremarkable.   Mastoids and visualized paranasal sinuses: Well aerated.   Skeleton: No suspicious bone lesions.   Upper chest: Unremarkable.   Other: None.   IMPRESSION: No suspicious mass or lymphadenopathy in the neck.     Electronically Signed   By: Ryan Chess M.D.   On: 02/22/2023 17:35      08/10/2022: She is doing great. She has only taken one Relpax  since laving my office in January. Doing Fantastic! Continue Qulipta . Relpax  worked great.  She has constipation, mag citrate helps but mag oxide causes a migraine. She is thrilled. She has a little heartburn. Famotidine for a little bit helped. Otherwise everything is great. She has jaw tightness and also she is on zonisamide . Could decrease to 50 and if the migraines get worse go right back up. Can decrease zonisamide  to 75 then 50 then 25 and if the migraines come back go back to latest dose.    Patient complains of symptoms per HPI as well as the following symptoms: none . Pertinent negatives and positives per HPI. All others negative   HPI:  Sherry Johnston is a 48 y.o. female here as requested by Katina Pfeiffer, PA-C for migraines. Transitioning from [facility]. Migraines since the age of 78, she has tried everything, she has been to [facility], [facility], she has tried and failed multiple medications, tried calendars and food diaries. Botox helped. Relpax  is the only thing that has ever helped stop the headache consistently. Worsening on Tgiving, On average she has having 5-8 migraines a month and < 10 total headache days a month. She has even tried botox with the the injectibles that helped. Weather is a trigger. She has visual static but aura rarely. No medication oberuse. Her migraines are pounding/pulsating.throbbing, light and sound sesitivity, nausea, moving makes it hurt, weather is huge trigger, hormones, discussed migraine with aura and contraindication for estrogen but if very hormonal and understands the risks then keeping her estrogen stable can help migraines, could try to stop periods for 3 months the IUD di dnot stop it has helped with the hormonal fluctuations. Sensitive to sugar. She has had thorough evaluation with courtney wharton. She has had MRIs of the brain twice last completed with [facility]. The migraines can be in the back of the left head, or behind the left eye/forehead points to the supratrochlear nerve and can spread to the top of the head and into the jaw, she gets trigger point massage for cervical myofascial pain and masseters hypertrophy and tightness, very tight muscles. Has had vomiting, has had to go to the ED for toradol . No other focal neurologic deficits, associated symptoms, inciting events or modifiable factors.  Reviewed notes, labs and imaging from outside physicians, which showed:  ANALGESICS: BC,  Goody's, Codeine,  Esig, Excedrin, Fioricet, Lorcet, Percocet, Tylenol , Vicodin ANTI-MIGRAINE: DHE inj, Imitrex in, Maxalt, Relpax , Zomig nasal, Nurtec every other day-ineffective, Ubrelvy- ineffective, Reyvow-side effects HEART/BP: Inderal, Verapamil DECONGESTANT/ANTIHISTAMINE: Allegra, Benadryl , Flonase, Sudafed ANTI-NAUSEANT:zofran  NSAIDS: Advil , Aleve, Celebrex MUSCLE RELAXANTS: Baclofen, Flexeril ANTI-CONVULSANTS: Depakote, Klonopin, Topamax, Zonegran -very effective STEROIDS: Prednisone SLEEPING PILLS/TRANQUILIZERS: Tylenol  PM ANTI-DEPRESSANTS: Effexor, Amitriptyline HERBAL: Coenzyme, Magnesium FIBROMYALGIA:  HORMONAL: Yasmine, progesterone  OTHER: Aimovig, Emgality, Ajovy-unable to get approved PROCEDURES FOR HEADACHES: Botox   Review of Systems: Patient complains of symptoms per HPI as well as the following symptoms migraines. Pertinent negatives and positives per HPI. All others negative.   Social History   Socioeconomic History   Marital status: Married    Spouse name: Not on file   Number of children: Not on file   Years of education: Not on file   Highest education level: Not on file  Occupational History   Not on file  Tobacco Use   Smoking status: Former    Types: Cigarettes   Smokeless tobacco: Never   Tobacco comments:    Quit age 58  Vaping Use   Vaping status: Never Used  Substance and Sexual Activity   Alcohol use: No    Alcohol/week: 0.0 standard drinks of alcohol   Drug use: No   Sexual activity: Yes    Birth control/protection: Other-see comments    Comment: withdrawal  Other Topics Concern   Not on file  Social History Narrative   Caffiene; 1 cup daily coffee, 1/2 caff in pm.     Real coke.   Social Drivers of Corporate Investment Banker Strain: Low Risk  (11/26/2020)   Received from [facility], [facility]   Overall Financial Resource Strain (CARDIA)    Difficulty of Paying Living Expenses: Not hard at all  Food Insecurity: No Food Insecurity  (11/28/2021)   Received from [facility], [facility]   Hunger Vital Sign    Worried About Running Out of Food in the Last Year: Never true    Ran Out of Food in the Last Year: Never true  Transportation Needs: No Transportation Needs (11/26/2020)   Received from [facility], [facility]   PRAPARE - Transportation    Lack of Transportation (Medical): No    Lack of Transportation (Non-Medical): No  Physical Activity: Inactive (11/26/2020)   Received from [facility], [facility]   Exercise Vital Sign    Days of Exercise per Week: 0 days    Minutes of Exercise per Session: 0 min  Stress: No Stress Concern Present (11/26/2020)   Received from [facility], [facility] - Occupational Stress Questionnaire    Feeling of Stress : Not at all  Social Connections: Unknown (09/01/2021)   Received from [facility], [facility]   Social Network    Social Network: Not on file  Intimate Partner Violence: Unknown (08/05/2021)   Received from [facility], [facility]   HITS    Physically Hurt: Not on file    Insult or Talk Down To: Not on file    Threaten Physical Harm: Not on file    Scream or Curse: Not on file    Family History  Problem Relation Age of Onset   Stroke Mother    Migraines Mother    Cancer Mother        breast   Atrial fibrillation Mother    Migraines Father    Cancer Father  prostate   Alcohol abuse Father    Hyperlipidemia Father    ADD / ADHD Father    Mental illness Brother        ADHD   COPD Maternal Grandmother        emphysema   Cancer Maternal Grandmother        thyroid  and colon   Mental illness Maternal Grandfather    Diabetes Paternal Grandmother    Heart disease Paternal Grandmother    Hypertension Paternal Grandmother    Hyperlipidemia Paternal Grandmother    Cancer Paternal Grandfather        bladder    Past Medical History:  Diagnosis Date   Acute mastitis of left breast  05/24/2008   Allergy    Anxiety    Bladder infection    Chronic headaches    since age 64   Constipation 11/24/2006   Dengue fever    2002   Depression    Ectopic pregnancy 11/26/2006   H/O candidiasis    H/O urinary frequency 09/13/2007   History of bulimia    History of shingles    17 yrs. old ?   Hx of migraines    Hx of migraines    with aura   Hypothyroidism    H/O   Infertility, female 12/25/2009   Nausea 11/24/2006   Spotting in first trimester 11/22/2006    @ 4-5 weeks    Patient Active Problem List   Diagnosis Date Noted   Migraine 08/27/2020   Family history of breast cancer 10/08/2015   Chronic migraine without aura 11/21/2013   SVD (spontaneous vaginal delivery) 04/19/2012   Flu vaccine need 02/25/2012   Elevated glucose tolerance test 01/25/2012   Infertility, female 10/13/2011   History of ectopic pregnancy 09/17/2011   Hypothyroidism 09/17/2011   H/O: depression 09/17/2011   Smoker 09/17/2011    Past Surgical History:  Procedure Laterality Date   COLON SURGERY     ECTOPIC PREGNANCY SURGERY     x 2   MANDIBLE FRACTURE SURGERY Bilateral    SALPINGECTOMY     right    Current Outpatient Medications  Medication Sig Dispense Refill   Atogepant  (QULIPTA ) 60 MG TABS Take 1 tablet (60 mg total) by mouth daily. 30 tablet 9   diclofenac  Sodium (VOLTAREN ) 1 % GEL Apply 4 g topically 4 (four) times daily as needed. 500 g 6   FLUoxetine (PROZAC) 40 MG capsule Take 40 mg by mouth daily.     methylPREDNISolone  (MEDROL  DOSEPAK) 4 MG TBPK tablet Take pills daily all together with food. Take the first dose (6 pills) as soon as possible. Take the rest each morning. For 6 days total 6-5-4-3-2-1. 21 tablet 1   ondansetron  (ZOFRAN -ODT) 8 MG disintegrating tablet ondansetron  8 mg disintegrating tablet     VYVANSE  40 MG CHEW Chew 1 tablet by mouth daily.     zonisamide  (ZONEGRAN ) 25 MG capsule Take 3 capsules (75 mg total) by mouth daily. 90 capsule 11   eletriptan   (RELPAX ) 40 MG tablet Take 1 tablet (40 mg total) by mouth every 2 (two) hours as needed for migraine or headache. May repeat in 2 hours if headache persists or recurs. 12 tablet 11   No current facility-administered medications for this visit.    Allergies as of 06/08/2023 - Review Complete 06/08/2023  Allergen Reaction Noted   Ibuprofen  Nausea And Vomiting and Other (See Comments) 11/21/2013   Tape Dermatitis 04/19/2012   Wound dressing adhesive Rash 10/25/2020  Hydrocortisone  03/24/2023   Rizatriptan Other (See Comments) 05/27/2021   Sumatriptan Other (See Comments) 06/23/2005    Vitals: BP 133/85 (BP Location: Left Arm, Patient Position: Sitting, Cuff Size: Small)   Pulse 98   Ht 5' 3 (1.6 m)   Wt 173 lb (78.5 kg)   BMI 30.65 kg/m  Last Weight:  Wt Readings from Last 1 Encounters:  06/08/23 173 lb (78.5 kg)   Last Height:   Ht Readings from Last 1 Encounters:  06/08/23 5' 3 (1.6 m)    Physical exam: Exam: Gen: NAD, conversant, well nourised, well groomed                     CV: RRR, no MRG. No Carotid Bruits. No peripheral edema, warm, nontender Eyes: Conjunctivae clear without exudates or hemorrhage SKIN: splotchy rashes in the lower legs and feet and forearms with some swelling around the joints of the ankle and knees.   Neuro: Detailed Neurologic Exam  Speech:    Speech is normal; fluent and spontaneous with normal comprehension.  Cognition:    The patient is oriented to person, place, and time;     recent and remote memory intact;     language fluent;     normal attention, concentration,     fund of knowledge Cranial Nerves:    The pupils are equal, round, and reactive to light. Visual fields are full to finger confrontation. Extraocular movements are intact. Trigeminal sensation is intact and the muscles of mastication are normal. The face is symmetric. The palate elevates in the midline. Hearing intact. Voice is normal. Shoulder shrug is normal. The  tongue has normal motion without fasciculations.   Coordination: nml  Gait: nml  Motor Observation:    No asymmetry, no atrophy, and no involuntary movements noted. Tone:    Normal muscle tone.    Posture:    Posture is normal. normal erect    Strength:    Strength is V/V in the upper and lower limbs.      Sensation: intact to LT     Reflex Exam:  DTR's:    Deep tendon reflexes in the upper and lower extremities are normal bilaterally.   Toes:    The toes are downgoing bilaterally.   Clonus:    Clonus is absent.   Assessment/Plan:  patient with an extremely long history of migraines tried multiple medications including injections and botox. Relpax  helps acutely. Toradol  injections help as well. Having what sound like rheumatologic symptms (rash, migrating joint pain and swelling, fatigue, joint pain, improved with steroids despite negative workup could be seronegative autoimmune disease as neurologic workup unrevealing) reviewed extensive workup with patient.  May have cervico genic dizziness as well (Cervicogenic dizziness is a type of dizziness caused by problems in the upper cervical spine (neck). It occurs when the joints and muscles in the neck become irritated or inflamed, sending incorrect signals to the brain about balance and spatial orientation. ) see addendum below with overview  Migraines are doing well. She feels the zonegran  75mg  is doing well. Qulipta  is also working great. Relpax  work acutely.    Addendum 05/19/2023: Neurologic workup unrevealing. MRI of the cervical spine with  Incidental Benign perineural cysts (discussed with patient). CT ABD/Pelvis unremarkable. MRI of the brain unremarkable.  Blood work included negative paraneoplastic antibodies, ANA 1-1 60 speckled pattern, ANCA normal, decreased vitamin D  23.9, normal CRP, ANA comprehensive panel negative, sed rate normal, ACE normal, IFE SPEP normal, B12/folate/methylmalonic  acid normal,normal b6 and b1.  EMG/NCS normal. No neurologic etiology found.  Follow up with pcp and rhaumatology. Reviewed images with patient and discussed at length  No orders of the defined types were placed in this encounter.  Meds ordered this encounter  Medications   eletriptan  (RELPAX ) 40 MG tablet    Sig: Take 1 tablet (40 mg total) by mouth every 2 (two) hours as needed for migraine or headache. May repeat in 2 hours if headache persists or recurs.    Dispense:  12 tablet    Refill:  11     Meds ordered this encounter  Medications   eletriptan  (RELPAX ) 40 MG tablet    Sig: Take 1 tablet (40 mg total) by mouth every 2 (two) hours as needed for migraine or headache. May repeat in 2 hours if headache persists or recurs.    Dispense:  12 tablet    Refill:  11    Cc: Katina Pfeiffer, PA-C,  Katina Pfeiffer, [place]  Onetha Epp, MD  [facility] [address] [place], [place] [address]  Phone [phone] Fax [phone]  I spent over 40 minutes of face-to-face and non-face-to-face time with patient on the  1. Chronic migraine without aura without status migrainosus, not intractable   2. Rash   3. Arthralgia, unspecified joint   4. Joint swelling    diagnosis.  This included previsit chart review, lab review, study review, order entry, electronic health record documentation, patient education on the different diagnostic and therapeutic options, counseling and coordination of care, risks and benefits of management, compliance, or risk factor reduction

## 2023-06-08 NOTE — Patient Instructions (Addendum)
 Cervicogenic Dizziness  Cervicogenic dizziness is a type of dizziness caused by problems in the upper cervical spine (neck). It occurs when the joints and muscles in the neck become irritated or inflamed, sending incorrect signals to the brain about balance and spatial orientation.  Symptoms  Lightheadedness or spinning sensation Unsteady gait Nausea Neck pain and stiffness Headache Blurred vision (in some cases)  Causes  Whiplash injury, Neck trauma, Arthritis, Disc degeneration, and Muscle strain.  Diagnosis  A doctor will typically perform a physical exam and ask about the patient's symptoms and medical history. They may also order imaging tests, such as an X-ray or MRI, to rule out other potential causes of dizziness.  Treatment  Treatment for cervicogenic dizziness typically involves:  Physical therapy: Exercises to strengthen neck muscles, improve balance, and reduce pain.  Medication: Pain relievers, muscle relaxants, or anti-nausea drugs may be prescribed.  Vestibular rehabilitation: Exercises to train the brain and inner ear to better process balance information.  Neck manipulation: In some cases, a doctor may perform gentle manipulations of the neck to improve joint alignment.  Prognosis  With proper treatment, most people with cervicogenic dizziness can recover fully within a few months. However, some may experience ongoing symptoms or have a higher risk of developing future episodes.  Prevention  Maintain good posture and avoid prolonged periods of sitting or standing in awkward positions.  Exercise regularly to strengthen neck and upper body muscles.  Seek medical attention promptly if you experience any symptoms of neck pain or dizziness.

## 2023-06-10 ENCOUNTER — Telehealth: Payer: Self-pay | Admitting: *Deleted

## 2023-06-10 NOTE — Telephone Encounter (Addendum)
 Received order for a non Live ZIO patch x4 weeks.  The non-live ZIO patch is only for 14 days.  Do they want to order two14 day ZIO patches back to back, or is one 14 day monitor sufficient. We do offer a 30 day cardiac event monitor as well if they definitely want 4 weeks,  but don't want patient charged twice. Message will be forwarded to Charmaine Bright, PA's nurse.

## 2023-06-14 ENCOUNTER — Ambulatory Visit: Payer: No Typology Code available for payment source | Attending: Family Medicine

## 2023-06-14 ENCOUNTER — Other Ambulatory Visit: Payer: Self-pay | Admitting: *Deleted

## 2023-06-14 DIAGNOSIS — R42 Dizziness and giddiness: Secondary | ICD-10-CM

## 2023-06-14 DIAGNOSIS — R002 Palpitations: Secondary | ICD-10-CM

## 2023-06-14 NOTE — Progress Notes (Unsigned)
 Transcribed order confirmed with Amalia Badder at [place] at [facility]. Enrolled for [employer] to mail a ZIO XT long term holter monitor to the patients address on file.   EP to read.

## 2023-06-21 DIAGNOSIS — R002 Palpitations: Secondary | ICD-10-CM

## 2023-06-21 DIAGNOSIS — R42 Dizziness and giddiness: Secondary | ICD-10-CM | POA: Diagnosis not present

## 2023-06-22 ENCOUNTER — Other Ambulatory Visit (HOSPITAL_COMMUNITY): Payer: Self-pay

## 2023-06-22 MED ORDER — LISDEXAMFETAMINE DIMESYLATE 40 MG PO CAPS
40.0000 mg | ORAL_CAPSULE | Freq: Every day | ORAL | 0 refills | Status: DC
  Filled 2023-06-22: qty 30, 30d supply, fill #0

## 2023-06-25 IMAGING — US US PELVIS COMPLETE WITH TRANSVAGINAL
1 series · 13 of 25 positions shown · non-contrast
Comparison: 07/29/2021

CLINICAL DATA: Uncertain IUD position, possible adenomyosis,
retroverted uterus; LMP 06/30/2021

EXAM:
TRANSABDOMINAL AND TRANSVAGINAL ULTRASOUND OF PELVIS
TECHNIQUE: Both transabdominal and transvaginal ultrasound examinations of the
pelvis were performed. Transabdominal technique was performed for
global imaging of the pelvis including uterus, ovaries, adnexal
regions, and pelvic cul-de-sac. It was necessary to proceed with
endovaginal exam following the transabdominal exam to visualize the
endometrium, IUD, and uterus.

[Series 1: us pelvis complete with transvaginal · 0.26mm/px · 85 acquisitions, 13 frames shown]
[im 1/85]
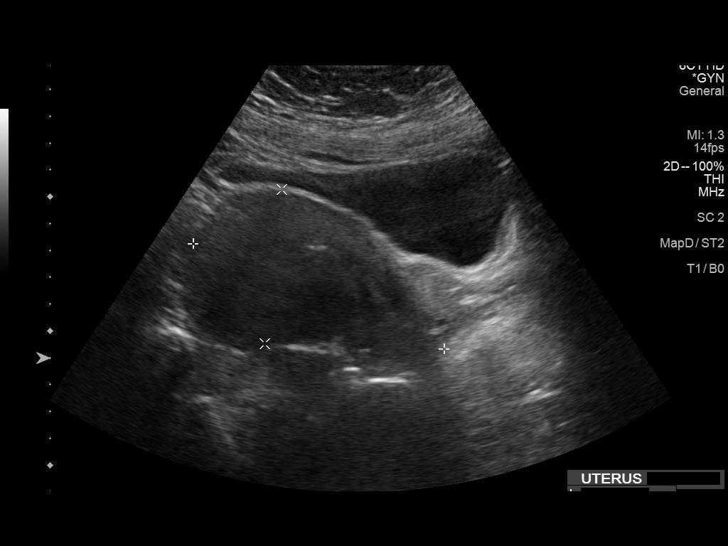
[im 8/85]
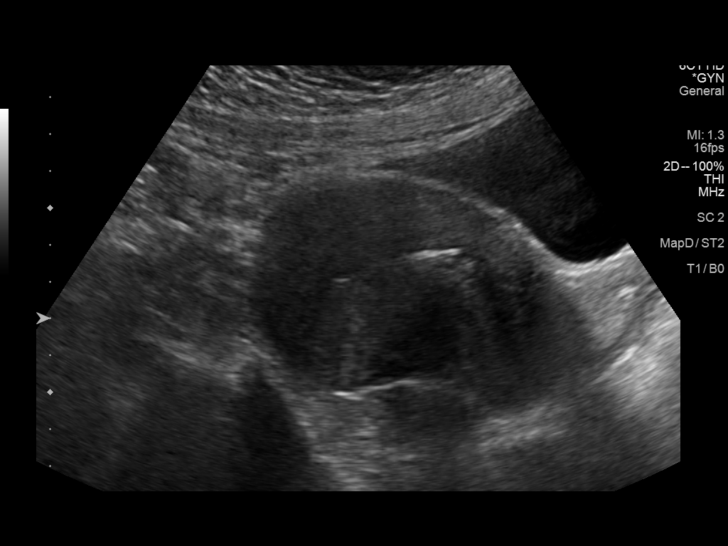
[im 15/85]
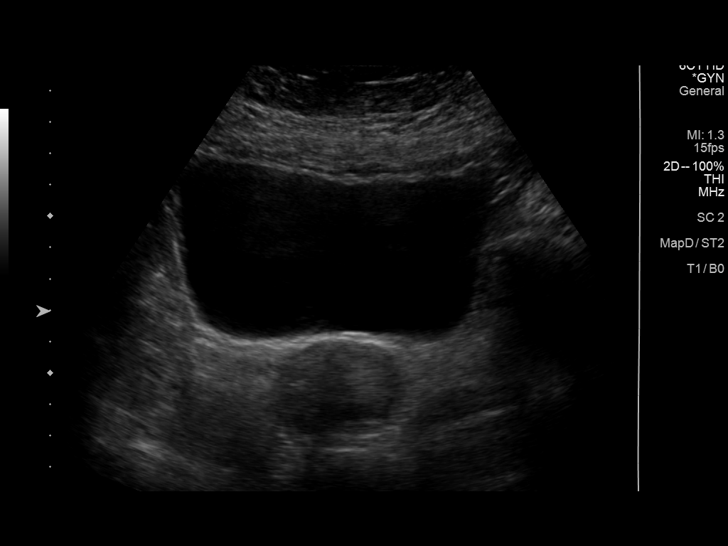
[im 22/85]
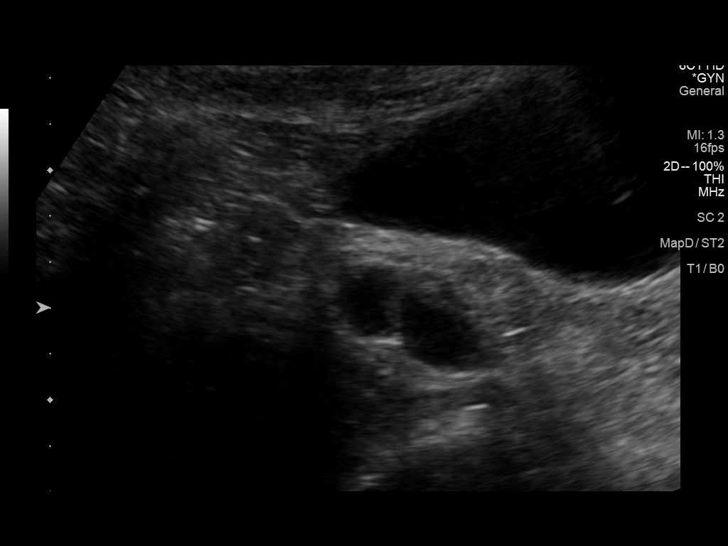
[im 29/85]
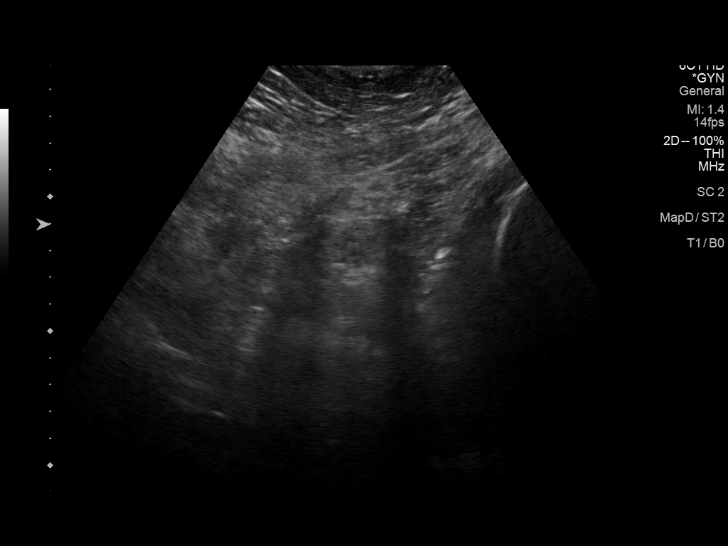
[im 36/85]
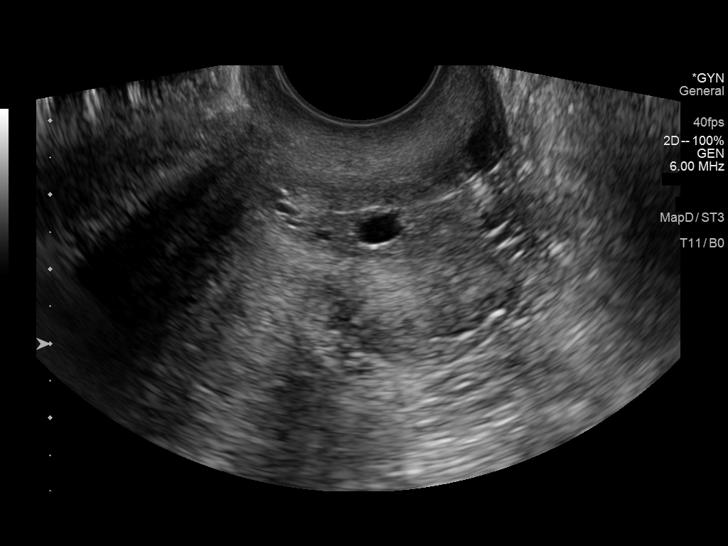
[im 43/85]
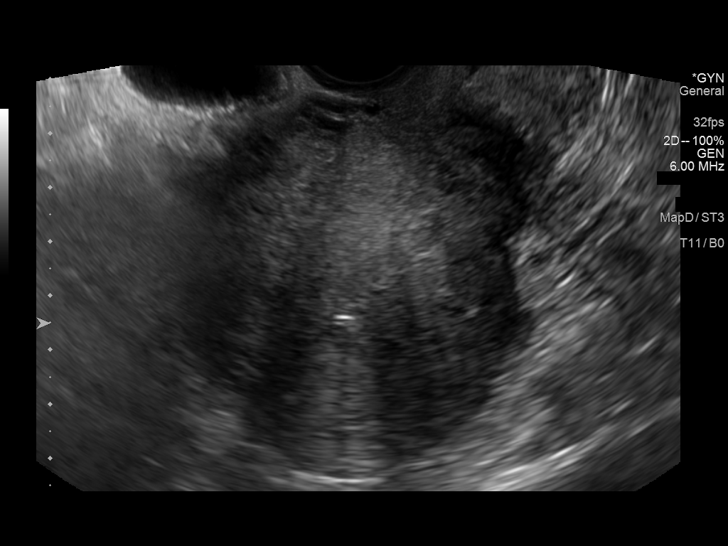
[im 50/85]
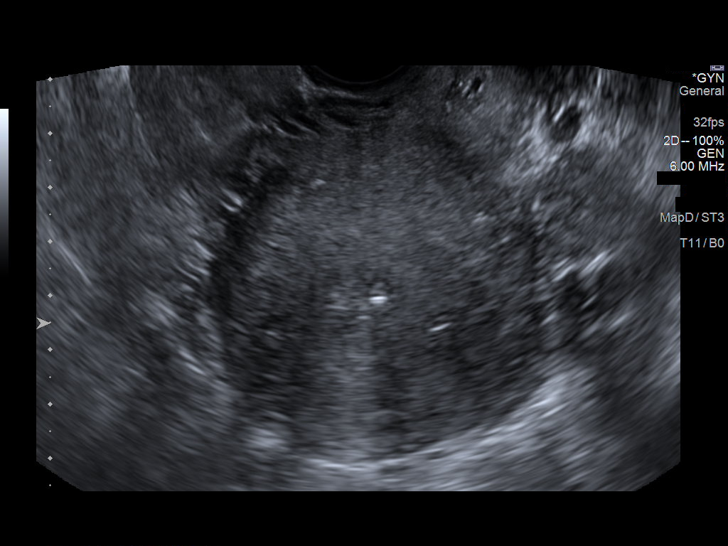
[im 57/85]
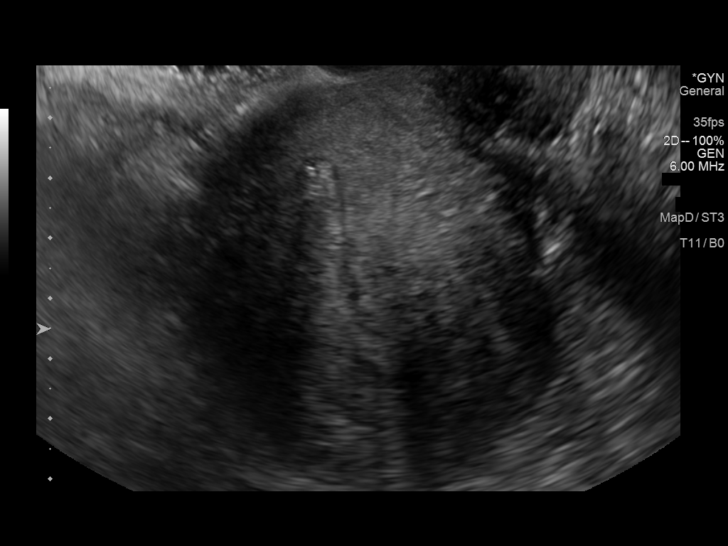
[im 64/85]
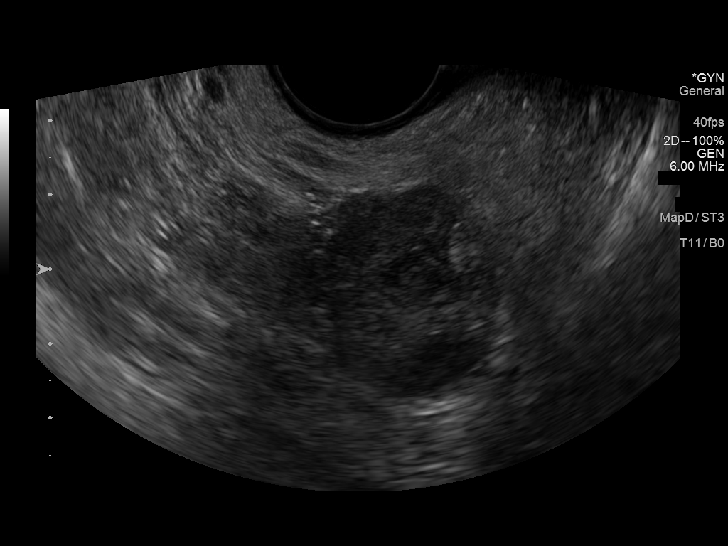
[im 71/85]
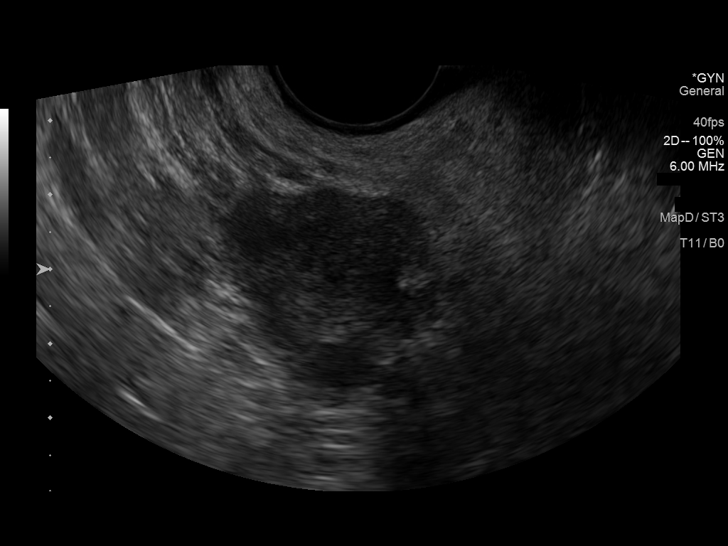
[im 78/85]
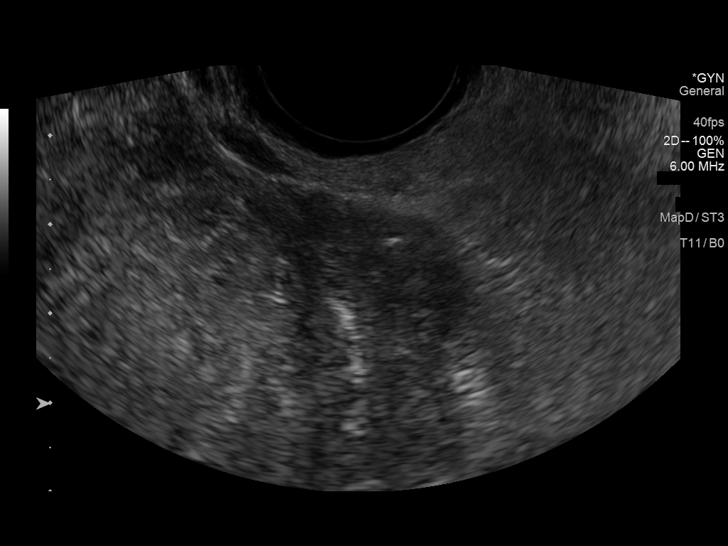
[im 85/85]
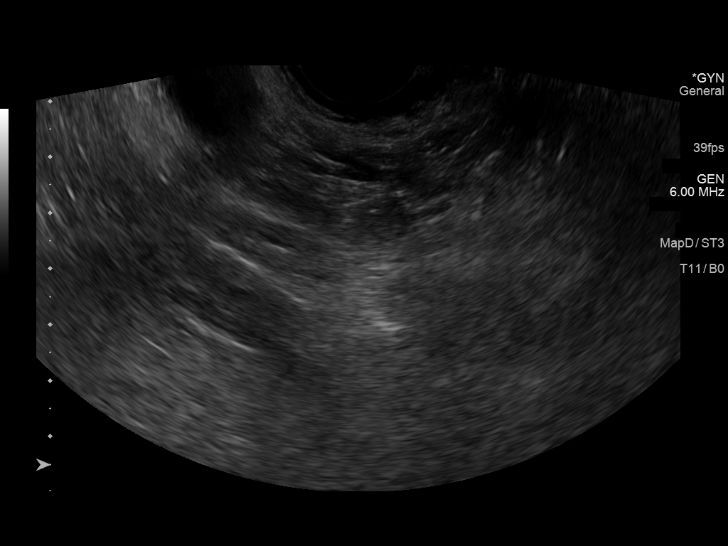

[13 of 25 positions shown; findings below may reference images not displayed]

FINDINGS: Uterus

Measurements: 10.1 x 5.8 x 6.0 cm = volume: 185 mL. Retroflexed.
Heterogeneous myometrium which appears somewhat thickened diffusely.
Few scattered areas of shadowing. Possibility of adenomyosis is
raised. Nabothian cysts at cervix. No discrete mass.

Endometrium

Thickness: 3 mm. IUD in expected position at upper uterine segment
endometrial canal. No endometrial fluid or mass

Right ovary

Measurements: 3.0 x 1.8 x 2.4 cm = volume: 7 mL. Normal morphology
without mass

Left ovary

Measurements: 2.4 x 1.6 x 2.6 cm = volume: 5 mL. Normal morphology
without mass

Other findings

No free pelvic fluid.  None axial masses.
IMPRESSION: IUD in expected position at upper uterine segment endometrial canal.

Heterogeneous myometrium with scattered areas of shadowing and
diffuse thickening of the myometrium at the upper uterus, cannot
exclude adenomyosis.

Remainder of exam unremarkable.

## 2023-07-06 ENCOUNTER — Other Ambulatory Visit: Payer: Self-pay | Admitting: Neurology

## 2023-07-07 ENCOUNTER — Other Ambulatory Visit (HOSPITAL_COMMUNITY): Payer: Self-pay

## 2023-07-07 MED ORDER — QULIPTA 60 MG PO TABS
60.0000 mg | ORAL_TABLET | Freq: Every day | ORAL | 9 refills | Status: DC
  Filled 2023-07-07: qty 30, 30d supply, fill #0
  Filled 2023-08-01: qty 30, 30d supply, fill #1
  Filled 2023-08-31: qty 30, 30d supply, fill #2
  Filled 2023-10-10: qty 30, 30d supply, fill #3
  Filled 2023-11-13: qty 30, 30d supply, fill #4
  Filled 2023-12-08: qty 30, 30d supply, fill #5

## 2023-07-20 ENCOUNTER — Other Ambulatory Visit (HOSPITAL_COMMUNITY): Payer: Self-pay

## 2023-07-20 MED ORDER — LISDEXAMFETAMINE DIMESYLATE 40 MG PO CAPS
40.0000 mg | ORAL_CAPSULE | Freq: Every day | ORAL | 0 refills | Status: DC
  Filled 2023-07-20: qty 30, 30d supply, fill #0

## 2023-08-01 ENCOUNTER — Encounter: Payer: Self-pay | Admitting: Neurology

## 2023-08-04 ENCOUNTER — Telehealth: Payer: Self-pay

## 2023-08-04 ENCOUNTER — Other Ambulatory Visit: Payer: Self-pay | Admitting: Obstetrics and Gynecology

## 2023-08-04 DIAGNOSIS — Z1231 Encounter for screening mammogram for malignant neoplasm of breast: Secondary | ICD-10-CM

## 2023-08-04 NOTE — Progress Notes (Signed)
 Order placed [facility]

## 2023-08-04 NOTE — Telephone Encounter (Signed)
 Pt LVM in triage line inquiring about a referral/order for mammogram. Stated that it was recommended and order for her previously. However, messed up her appt with imaging center x2 and then never followed through.   Appt scheduled w/ BS on 4/29 for OV for menopausal sxs. Last seen in 2023-per EMR investigation, no AEX on file.   Please advise.

## 2023-08-04 NOTE — Telephone Encounter (Signed)
 I placed an order for a screening mammogram at [facility].  The patient will need to call to schedule this appointment.

## 2023-08-04 NOTE — Telephone Encounter (Signed)
LDVM on machine per DPR. Encounter closed.

## 2023-08-11 ENCOUNTER — Other Ambulatory Visit (HOSPITAL_COMMUNITY): Payer: Self-pay

## 2023-08-11 MED ORDER — LISDEXAMFETAMINE DIMESYLATE 40 MG PO CAPS
40.0000 mg | ORAL_CAPSULE | Freq: Every day | ORAL | 0 refills | Status: DC
  Filled 2023-08-27: qty 30, 30d supply, fill #0

## 2023-08-27 ENCOUNTER — Other Ambulatory Visit: Payer: Self-pay | Admitting: Neurology

## 2023-08-27 ENCOUNTER — Other Ambulatory Visit (HOSPITAL_COMMUNITY): Payer: Self-pay

## 2023-08-30 ENCOUNTER — Encounter: Payer: Self-pay | Admitting: Obstetrics and Gynecology

## 2023-08-30 ENCOUNTER — Ambulatory Visit
Admission: RE | Admit: 2023-08-30 | Discharge: 2023-08-30 | Disposition: A | Discharge status: Home / Self Care | Source: Ambulatory Visit | Attending: Obstetrics and Gynecology | Admitting: Obstetrics and Gynecology

## 2023-08-30 ENCOUNTER — Other Ambulatory Visit: Payer: Self-pay

## 2023-08-30 DIAGNOSIS — Z1231 Encounter for screening mammogram for malignant neoplasm of breast: Secondary | ICD-10-CM

## 2023-08-30 NOTE — Progress Notes (Deleted)
 GYNECOLOGY  VISIT   HPI: 48 y.o.   Married  Caucasian non-Hispanic female   [phone] with No LMP recorded (lmp unknown). (Menstrual status: IUD).   here for: ***     AEX is scheduled for 01/13/2024.   GYNECOLOGIC HISTORY: No LMP recorded (lmp unknown). (Menstrual status: IUD). Contraception: Mirena  IUD (07/28/2021) Menopausal hormone therapy: none Last 2 paps: 2021-WNL per pt History of abnormal Pap or positive HPV:  {YES JX:91478} Mammogram: 08/30/2023-?        OB History     Gravida  5   Para  2   Term  2   Preterm  0   AB  3   Living  2      SAB  0   IAB  0   Ectopic  3   Multiple  0   Live Births  1              Patient Active Problem List   Diagnosis Date Noted   Migraine 08/27/2020   Family history of breast cancer 10/08/2015   Chronic migraine without aura 11/21/2013   SVD (spontaneous vaginal delivery) 04/19/2012   Flu vaccine need 02/25/2012   Elevated glucose tolerance test 01/25/2012   Infertility, female 10/13/2011   History of ectopic pregnancy 09/17/2011   Hypothyroidism 09/17/2011   H/O: depression 09/17/2011   Smoker 09/17/2011    Past Medical History:  Diagnosis Date   Acute mastitis of left breast 05/24/2008   Allergy    Anxiety    Bladder infection    Chronic headaches    since age 45   Constipation 11/24/2006   Dengue fever    2002   Depression    Ectopic pregnancy 11/26/2006   H/O candidiasis    H/O urinary frequency 09/13/2007   History of bulimia    History of shingles    17 yrs. old ?   Hx of migraines    Hx of migraines    with aura   Hypothyroidism    H/O   Infertility, female 12/25/2009   Nausea 11/24/2006   Spotting in first trimester 11/22/2006    @ 4-5 weeks    Past Surgical History:  Procedure Laterality Date   COLON SURGERY     ECTOPIC PREGNANCY SURGERY     x 2   MANDIBLE FRACTURE SURGERY Bilateral    SALPINGECTOMY     right    Current Outpatient Medications  Medication Sig Dispense  Refill   Atogepant  (QULIPTA ) 60 MG TABS Take 1 tablet (60 mg total) by mouth daily. 30 tablet 9   diclofenac  Sodium (VOLTAREN ) 1 % GEL Apply 4 g topically 4 (four) times daily as needed. 500 g 6   eletriptan  (RELPAX ) 40 MG tablet Take 1 tablet (40 mg total) by mouth every 2 (two) hours as needed for migraine or headache. May repeat in 2 hours if headache persists or recurs. 12 tablet 11   FLUoxetine (PROZAC) 40 MG capsule Take 40 mg by mouth daily.     lisdexamfetamine (VYVANSE ) 40 MG capsule Take 1 capsule by mouth daily 30 capsule 0   methylPREDNISolone  (MEDROL  DOSEPAK) 4 MG TBPK tablet Take pills daily all together with food. Take the first dose (6 pills) as soon as possible. Take the rest each morning. For 6 days total 6-5-4-3-2-1. 21 tablet 1   ondansetron  (ZOFRAN -ODT) 8 MG disintegrating tablet ondansetron  8 mg disintegrating tablet     VYVANSE  40 MG CHEW Chew 1 tablet by mouth daily.  zonisamide  (ZONEGRAN ) 25 MG capsule Take 3 capsules (75 mg total) by mouth daily. 90 capsule 11   No current facility-administered medications for this visit.     ALLERGIES: Ibuprofen , Tape, Wound dressing adhesive, Hydrocortisone, Rizatriptan, and Sumatriptan  Family History  Problem Relation Age of Onset   Stroke Mother    Migraines Mother    Cancer Mother        breast   Atrial fibrillation Mother    Migraines Father    Cancer Father        prostate   Alcohol abuse Father    Hyperlipidemia Father    ADD / ADHD Father    Mental illness Brother        ADHD   COPD Maternal Grandmother        emphysema   Cancer Maternal Grandmother        thyroid  and colon   Mental illness Maternal Grandfather    Diabetes Paternal Grandmother    Heart disease Paternal Grandmother    Hypertension Paternal Grandmother    Hyperlipidemia Paternal Grandmother    Cancer Paternal Grandfather        bladder    Social History   Socioeconomic History   Marital status: Married    Spouse name: Not on file    Number of children: Not on file   Years of education: Not on file   Highest education level: Not on file  Occupational History   Not on file  Tobacco Use   Smoking status: Former    Types: Cigarettes   Smokeless tobacco: Never   Tobacco comments:    Quit age 39  Vaping Use   Vaping status: Never Used  Substance and Sexual Activity   Alcohol use: No    Alcohol/week: 0.0 standard drinks of alcohol   Drug use: No   Sexual activity: Yes    Birth control/protection: Other-see comments    Comment: withdrawal  Other Topics Concern   Not on file  Social History Narrative   Caffiene; 1 cup daily coffee, 1/2 caff in pm.     Real coke.   Social Drivers of Corporate investment banker Strain: Low Risk  (11/26/2020)   Received from [facility], [facility]   Overall Financial Resource Strain (CARDIA)    Difficulty of Paying Living Expenses: Not hard at all  Food Insecurity: No Food Insecurity (11/28/2021)   Received from [facility], [facility]   Hunger Vital Sign    Worried About Running Out of Food in the Last Year: Never true    Ran Out of Food in the Last Year: Never true  Transportation Needs: No Transportation Needs (11/26/2020)   Received from [facility], [facility]   PRAPARE - Transportation    Lack of Transportation (Medical): No    Lack of Transportation (Non-Medical): No  Physical Activity: Inactive (11/26/2020)   Received from [facility], [facility]   Exercise Vital Sign    Days of Exercise per Week: 0 days    Minutes of Exercise per Session: 0 min  Stress: No Stress Concern Present (11/26/2020)   Received from [facility], [facility] - Occupational Stress Questionnaire    Feeling of Stress : Not at all  Social Connections: Unknown (09/01/2021)   Received from [facility], [facility]   Social Network    Social Network: Not on file  Intimate Partner Violence: Unknown (08/05/2021)   Received from  [facility], [facility]   HITS    Physically Hurt: Not on file    Insult or Talk Down To: Not on file    Threaten Physical Harm: Not on file    Scream or Curse: Not on file    Review of Systems  PHYSICAL EXAMINATION:   LMP  (LMP Unknown)     General appearance: alert, cooperative and appears stated age Head: Normocephalic, without obvious abnormality, atraumatic Neck: no adenopathy, supple, symmetrical, trachea midline and thyroid  normal to inspection and palpation Lungs: clear to auscultation bilaterally Breasts: normal appearance, no masses or tenderness, No nipple retraction or dimpling, No nipple discharge or bleeding, No axillary or supraclavicular adenopathy Heart: regular rate and rhythm Abdomen: soft, non-tender, no masses,  no organomegaly Extremities: extremities normal, atraumatic, no cyanosis or edema Skin: Skin color, texture, turgor normal. No rashes or lesions Lymph nodes: Cervical, supraclavicular, and axillary nodes normal. No abnormal inguinal nodes palpated Neurologic: Grossly normal  Pelvic: External genitalia:  no lesions              Urethra:  normal appearing urethra with no masses, tenderness or lesions              Bartholins and Skenes: normal                 Vagina: normal appearing vagina with normal color and discharge, no lesions              Cervix: no lesions                Bimanual Exam:  Uterus:  normal size, contour, position, consistency, mobility, non-tender              Adnexa: no mass, fullness, tenderness              Rectal exam: {yes no:314532}.  Confirms.              Anus:  normal sphincter tone, no lesions  Chaperone was present for exam:  {BSCHAPERONE:31226::"Emily F, CMA"}  ASSESSMENT:    PLAN:    {LABS (Optional):23779}  ***  total time was spent for this patient encounter, including preparation, face-to-face counseling with the patient, coordination of care, and documentation of the encounter.

## 2023-08-31 ENCOUNTER — Ambulatory Visit: Admitting: Obstetrics and Gynecology

## 2023-09-01 ENCOUNTER — Encounter: Payer: Self-pay | Admitting: Obstetrics and Gynecology

## 2023-09-06 ENCOUNTER — Other Ambulatory Visit (HOSPITAL_COMMUNITY): Payer: Self-pay

## 2023-09-06 MED ORDER — LISDEXAMFETAMINE DIMESYLATE 40 MG PO CAPS
40.0000 mg | ORAL_CAPSULE | Freq: Every day | ORAL | 0 refills | Status: DC
  Filled 2023-10-01: qty 30, 30d supply, fill #0

## 2023-09-07 ENCOUNTER — Other Ambulatory Visit (HOSPITAL_COMMUNITY): Payer: Self-pay

## 2023-09-20 ENCOUNTER — Telehealth: Payer: No Typology Code available for payment source | Admitting: Neurology

## 2023-10-01 ENCOUNTER — Other Ambulatory Visit (HOSPITAL_COMMUNITY): Payer: Self-pay

## 2023-10-04 ENCOUNTER — Other Ambulatory Visit: Payer: Self-pay | Admitting: Neurology

## 2023-10-08 ENCOUNTER — Other Ambulatory Visit (HOSPITAL_COMMUNITY): Payer: Self-pay

## 2023-10-08 MED ORDER — LISDEXAMFETAMINE DIMESYLATE 40 MG PO CAPS
40.0000 mg | ORAL_CAPSULE | Freq: Every day | ORAL | 0 refills | Status: AC
  Filled 2023-10-08 – 2023-12-08 (×2): qty 30, 30d supply, fill #0

## 2023-11-04 ENCOUNTER — Other Ambulatory Visit (HOSPITAL_COMMUNITY): Payer: Self-pay

## 2023-11-04 MED ORDER — LISDEXAMFETAMINE DIMESYLATE 40 MG PO CAPS
40.0000 mg | ORAL_CAPSULE | Freq: Every day | ORAL | 0 refills | Status: AC
  Filled 2023-11-04: qty 30, 30d supply, fill #0

## 2023-11-08 ENCOUNTER — Other Ambulatory Visit: Payer: Self-pay | Admitting: Neurology

## 2023-11-13 ENCOUNTER — Other Ambulatory Visit (HOSPITAL_COMMUNITY): Payer: Self-pay

## 2023-12-05 ENCOUNTER — Other Ambulatory Visit: Payer: Self-pay | Admitting: Medical Genetics

## 2023-12-06 ENCOUNTER — Other Ambulatory Visit (HOSPITAL_COMMUNITY): Payer: Self-pay

## 2023-12-06 ENCOUNTER — Telehealth: Payer: No Typology Code available for payment source | Admitting: Family Medicine

## 2023-12-06 MED ORDER — LISDEXAMFETAMINE DIMESYLATE 40 MG PO CAPS
40.0000 mg | ORAL_CAPSULE | Freq: Every day | ORAL | 0 refills | Status: AC
  Filled 2023-12-08 – 2024-03-27 (×3): qty 30, 30d supply, fill #0

## 2023-12-08 ENCOUNTER — Other Ambulatory Visit: Payer: Self-pay

## 2023-12-08 ENCOUNTER — Other Ambulatory Visit (HOSPITAL_COMMUNITY): Payer: Self-pay

## 2023-12-10 ENCOUNTER — Other Ambulatory Visit (HOSPITAL_COMMUNITY): Payer: Self-pay

## 2023-12-13 NOTE — Progress Notes (Signed)
 PATIENT: Sherry Johnston DOB: Oct 21, 1975  REASON FOR VISIT: follow up HISTORY FROM: patient  Virtual Visit via MyChart video  I connected with Sherry Johnston on 12/14/23 at  8:45 AM EDT via MyChart video and verified that I am speaking with the correct person using two identifiers.   I discussed the limitations, risks, security and privacy concerns of performing an evaluation and management service by Mychart video and the availability of in person appointments. I also discussed with the patient that there may be a patient responsible charge related to this service. The patient expressed understanding and agreed to proceed.   History of Present Illness:  12/14/23 ALL (MyChart): Sherry Johnston is a 48 y.o. female here today for follow up for migraines. She was last seen by Dr Ines 06/2023 and and fairly well managed on zonisamide  75mg  daily, Qulipta  60mg  daily and Relpax  as needed. Nurtec previously tried but not covered.   Since, she reports doing well. May have some neck tension 4-5 times a month. Usually 2-3 migraines a month. Relpax  works well. She does report a visual aura. She describes this as visual static. She does not use estrogen. Has Mirena . 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 She started methotrexate. Symptoms seem better.   History (copied from Dr Sharion previous note)  06/08/2023: she felt better on prednisone, no primary neurologic disorder found, f/u pcp and rheumatology. Reviewed extensive workup and reviewed MRI images with patient pointing out the cervical muscles and spine and spinal cord and perineural cysts(benign) and benign small-vessel disease.    Addendum 05/19/2023: Neurologic workup unrevealing. MRI of the cervical spine with  Incidental Benign perineural cysts (discussed with patient). CT ABD/Pelvis unremarkable. MRI of the brain unremarkable.  Blood work included negative paraneoplastic antibodies, ANA 1-1 60 speckled  pattern, ANCA normal, decreased vitamin D  23.9, normal CRP, ANA comprehensive panel negative, sed rate normal, ACE normal, IFE SPEP normal, B12/folate/methylmalonic acid normal,normal b6 and b1. EMG/NCS normal. No neurologic etiology found.   03/24/2023: She stopped the zonegran . Her life went hay wire. She started getting breakthrough migraines. She has not had a migraine, the zonegran  and the qulipta  together helps. She tried stopping the zonegran  and had some breakthrough and she added back the zonegran  and the combination is magic, she hasn;t even had to use her relpax . She has purply mottly rash both arms, both legs, itchy, hives, sun sensitive, BUE/BLE numbness and tingling and neck mass. All started on 10/18 and acute onset numbness and tingling in the hands and feet acutely and arms, comes and goes. Halloween night had burning starting in the feet patches that move around not in one distribution she feels sunburned in the low back. But no raiation from the low back into the legs, the neuropathy starts in the feet and travels upwards and can be random like in a pattern of clouds which is no pattern, not in a dermatomal pattern either. Dermayology felt it and believes it is very deep or wouldn;t biopsy it didn;t feel it was dermatologic. Around c2/c3 feels like on the protruding vertebra hard but mobilem doesn't feel like its in the dermis or subc tissue feels like it is more bony. Fatigue. Brain fog. Extreme fatigue. Rash. She is ANA positive and joint pain, gets dizzy when pushing down on the lesion, feels like a 3mm round sphere. Dizziness. Night sweats. Vision changes blurry/diplopia, worse in the right eye - monocular vision loss. No other focal neurologic deficits, associated symptoms, inciting events or  modifiable factors.  08/10/2022: She is doing great. She has only taken one Relpax  since laving my office in January. Doing Fantastic! Continue Qulipta . Relpax  worked Firefighter.  She has constipation, mag  citrate helps but mag oxide causes a migraine. She is thrilled. She has a little heartburn. Famotidine for a little bit helped. Otherwise everything is great. She has jaw tightness and also she is on zonisamide . Could decrease to 50 and if the migraines get worse go right back up. Can decrease zonisamide  to 75 then 50 then 25 and if the migraines come back go back to latest dose.   HPI:  Sherry Johnston is a 48 y.o. female here as requested by Katina Pfeiffer, PA-C for migraines. Transitioning from [facility]. Migraines since the age of 56, she has tried everything, [facility] has [facility], she has tried and failed multiple medications, tried calendars and food diaries. Botox helped. Relpax  is the only thing that has ever helped stop the headache consistently. Worsening on Tgiving, On average she has having 5-8 migraines a month and < 10 total headache days a month. She has even tried botox with the the injectibles that helped. Weather is a trigger. She has visual static but aura rarely. No medication oberuse. Her migraines are pounding/pulsating.throbbing, light and sound sesitivity, nausea, moving makes it hurt, weather is huge trigger, hormones, discussed migraine with aura and contraindication for estrogen but if very hormonal and understands the risks then keeping her estrogen stable can help migraines, could try to stop periods for 3 months the IUD di dnot stop it has helped with the hormonal fluctuations. Sensitive to sugar. She has had thorough evaluation with courtney wharton. She has had MRIs of the brain twice last completed with [facility]. The migraines can be in the back of the left head, or behind the left eye/forehead points to the supratrochlear nerve and can spread to the top of the head and into the jaw, she gets trigger point massage for cervical myofascial pain and masseters hypertrophy and tightness, very tight muscles. Has had vomiting, has had  to go to the ED for toradol . No other focal neurologic deficits, associated symptoms, inciting events or modifiable factors.   Reviewed notes, labs and imaging from outside physicians, which showed:   ANALGESICS: BC, Goody's, Codeine, Esig, Excedrin, Fioricet, Lorcet, Percocet, Tylenol , Vicodin ANTI-MIGRAINE: DHE inj, Imitrex in, Maxalt, Relpax , Zomig nasal, Nurtec every other day-ineffective, Ubrelvy- ineffective, Reyvow-side effects HEART/BP: Inderal, Verapamil DECONGESTANT/ANTIHISTAMINE: Allegra, Benadryl , Flonase, Sudafed ANTI-NAUSEANT:zofran  NSAIDS: Advil , Aleve, Celebrex MUSCLE RELAXANTS: Baclofen, Flexeril ANTI-CONVULSANTS: Depakote, Klonopin, Topamax, Zonegran -very effective STEROIDS: Prednisone SLEEPING PILLS/TRANQUILIZERS: Tylenol  PM ANTI-DEPRESSANTS: Effexor, Amitriptyline HERBAL: Coenzyme, Magnesium FIBROMYALGIA:  HORMONAL: Yasmine, progesterone  OTHER: Aimovig, Emgality, Ajovy-unable to get approved PROCEDURES FOR HEADACHES: Botox    Observations/Objective:  Generalized: Well developed, in no acute distress  Mentation: Alert oriented to time, place, history taking. Follows all commands speech and language fluent   Assessment and Plan:  48 y.o. year old female  has a past medical history of Acute mastitis of left breast (05/24/2008), Allergy, Anxiety, Bladder infection, Chronic headaches, Constipation (11/24/2006), Dengue fever, Depression, Ectopic pregnancy (11/26/2006), H/O candidiasis, H/O urinary frequency (09/13/2007), History of bulimia, History of shingles, migraines, migraines, Hypothyroidism, Infertility, female (12/25/2009), Nausea (11/24/2006), and Spotting in first trimester (11/22/2006). here with    ICD-10-CM   1. Chronic migraine without aura without status migrainosus, not intractable  G43.709      Sherry Johnston reports migraines are well managed. We will continue Qulipta   60mg  daily, zonisamide  75mg  daily and Nurtec as needed. She will continue close follow  up with her care team. Healthy lifestyle habits encouraged. Follow up with me in 1 year.    No orders of the defined types were placed in this encounter.   Meds ordered this encounter  Medications   zonisamide  (ZONEGRAN ) 25 MG capsule    Sig: Take 3 capsules (75 mg total) by mouth daily.    Dispense:  270 capsule    Refill:  3    Supervising Provider:   AHERN, ANTONIA B [[id number]]   eletriptan  (RELPAX ) 40 MG tablet    Sig: Take 1 tablet (40 mg total) by mouth every 2 (two) hours as needed for migraine or headache. May repeat in 2 hours if headache persists or recurs.    Dispense:  12 tablet    Refill:  11    Supervising Provider:   AHERN, ANTONIA B [[id number]]   Atogepant  (QULIPTA ) 60 MG TABS    Sig: Take 1 tablet (60 mg total) by mouth daily.    Dispense:  90 tablet    Refill:  3    Supervising Provider:   INES ONETHA NOVAK [[id number]]     Follow Up Instructions:  I discussed the assessment and treatment plan with the patient. The patient was provided an opportunity to ask questions and all were answered. The patient agreed with the plan and demonstrated an understanding of the instructions.   The patient was advised to call back or seek an in-person evaluation if the symptoms worsen or if the condition fails to improve as anticipated.  I provided 20 minutes of face-to-face and non face-to-face time during this MyChart video encounter. Patient located at their place of residence. Provider is in the office.    Savilla Turbyfill, NP

## 2023-12-13 NOTE — Patient Instructions (Signed)
 Below is our plan:  We will continue Qulipta  60mg  daily, zonisamide  75mg  daily and Nurtec as needed.  Please make sure you are staying well hydrated. I recommend 50-60 ounces daily. Well balanced diet and regular exercise encouraged. Consistent sleep schedule with 6-8 hours recommended.   Please continue follow up with care team as directed.   Follow up with me in 1 year   You may receive a survey regarding today's visit. I encourage you to leave honest feed back as I do use this information to improve patient care. Thank you for seeing me today!   GENERAL HEADACHE INFORMATION:   Natural supplements: Magnesium Oxide or Magnesium Glycinate 500 mg at bed (up to 800 mg daily) Coenzyme Q10 300 mg in AM Vitamin B2- 200 mg twice a day   Add 1 supplement at a time since even natural supplements can have undesirable side effects. You can sometimes buy supplements cheaper (especially Coenzyme Q10) at www.WebmailGuide.co.za or at [facility].  Migraine with aura: There is increased risk for stroke in women with migraine with aura and a contraindication for the combined contraceptive pill for use by women who have migraine with aura. The risk for women with migraine without aura is lower. However other risk factors like smoking are far more likely to increase stroke risk than migraine. There is a recommendation for no smoking and for the use of OCPs without estrogen such as progestogen only pills particularly for women with migraine with aura.SABRA People who have migraine headaches with auras may be 3 times more likely to have a stroke caused by a blood clot, compared to migraine patients who don't see auras. Women who take hormone-replacement therapy may be 30 percent more likely to suffer a clot-based stroke than women not taking medication containing estrogen. Other risk factors like smoking and high blood pressure may be  much more important.    Vitamins and herbs that show potential:   Magnesium: Magnesium (250  mg twice a day or 500 mg at bed) has a relaxant effect on smooth muscles such as blood vessels. Individuals suffering from frequent or daily headache usually have low magnesium levels which can be increase with daily supplementation of 400-750 mg. Three trials found 40-90% average headache reduction  when used as a preventative. Magnesium may help with headaches are aura, the best evidence for magnesium is for migraine with aura is its thought to stop the cortical spreading depression we believe is the pathophysiology of migraine aura.Magnesium also demonstrated the benefit in menstrually related migraine.  Magnesium is part of the messenger system in the serotonin cascade and it is a good muscle relaxant.  It is also useful for constipation which can be a side effect of other medications used to treat migraine. Good sources include nuts, whole grains, and tomatoes. Side Effects: loose stool/diarrhea  Riboflavin (vitamin B 2) 200 mg twice a day. This vitamin assists nerve cells in the production of ATP a principal energy storing molecule.  It is necessary for many chemical reactions in the body.  There have been at least 3 clinical trials of riboflavin using 400 mg per day all of which suggested that migraine frequency can be decreased.  All 3 trials showed significant improvement in over half of migraine sufferers.  The supplement is found in bread, cereal, milk, meat, and poultry.  Most Americans get more riboflavin than the recommended daily allowance, however riboflavin deficiency is not necessary for the supplements to help prevent headache. Side effects: energizing, green urine  Coenzyme Q10: This is present in almost all cells in the body and is critical component for the conversion of energy.  Recent studies have shown that a nutritional supplement of CoQ10 can reduce the frequency of migraine attacks by improving the energy production of cells as with riboflavin.  Doses of 150 mg twice a day have been  shown to be effective.   Melatonin: Increasing evidence shows correlation between melatonin secretion and headache conditions.  Melatonin supplementation has decreased headache intensity and duration.  It is widely used as a sleep aid.  Sleep is natures way of dealing with migraine.  A dose of 3 mg is recommended to start for headaches including cluster headache. Higher doses up to 15 mg has been reviewed for use in Cluster headache and have been used. The rationale behind using melatonin for cluster is that many theories regarding the cause of Cluster headache center around the disruption of the normal circadian rhythm in the brain.  This helps restore the normal circadian rhythm.   HEADACHE DIET: Foods and beverages which may trigger migraine Note that only 20% of headache patients are food sensitive. You will know if you are food sensitive if you get a headache consistently 20 minutes to 2 hours after eating a certain food. Only cut out a food if it causes headaches, otherwise you might remove foods you enjoy! What matters most for diet is to eat a well balanced healthy diet full of vegetables and low fat protein, and to not miss meals.   Chocolate, other sweets ALL cheeses except cottage and cream cheese Dairy products, yogurt, sour cream, ice cream Liver Meat extracts (Bovril, Marmite, meat tenderizers) Meats or fish which have undergone aging, fermenting, pickling or smoking. These include: Hotdogs,salami,Lox,sausage, mortadellas,smoked salmon, pepperoni, Pickled herring Pods of broad bean (English beans, Chinese pea pods, [place] (fava) beans, lima and navy beans Ripe avocado, ripe banana Yeast extracts or active yeast preparations such as Brewer's or Fleishman's (commercial bakes goods are permitted) Tomato based foods, pizza (lasagna, etc.)   MSG (monosodium glutamate) is disguised as many things; look for these common aliases: Monopotassium glutamate Autolysed yeast Hydrolysed  protein Sodium caseinate "flavorings" "all natural preservatives Nutrasweet   Avoid all other foods that convincingly provoke headaches.   Resources: The Dizzy Bluford Aid Your Headache Diet, migrainestrong.com  https://zamora-andrews.com/   Caffeine  and Migraine For patients that have migraine, caffeine  intake more than 3 days per week can lead to dependency and increased migraine frequency. I would recommend cutting back on your caffeine  intake as best you can. The recommended amount of caffeine  is 200-300 mg daily, although migraine patients may experience dependency at even lower doses. While you may notice an increase in headache temporarily, cutting back will be helpful for headaches in the long run. For more information on caffeine  and migraine, visit: https://americanmigrainefoundation.org/resource-library/caffeine -and-migraine/   Headache Prevention Strategies:   1. Maintain a headache diary; learn to identify and avoid triggers.  - This can be a simple note where you log when you had a headache, associated symptoms, and medications used - There are several smartphone apps developed to help track migraines: Migraine Buddy, Migraine Monitor, Curelator N1-Headache App   Common triggers include: Emotional triggers: Emotional/Upset family or friends Emotional/Upset occupation Business reversal/success Anticipation anxiety Crisis-serious Post-crisis periodNew job/position   Physical triggers: Vacation Day Weekend Strenuous Exercise High Altitude Location New Move Menstrual Day Physical Illness Oversleep/Not enough sleep Weather changes Light: Photophobia or light sesnitivity treatment involves a balance between desensitization and reduction  in overly strong input. Use dark polarized glasses outside, but not inside. Avoid bright or fluorescent light, but do not dim environment to the point that going into a normally lit room  hurts. Consider FL-41 tint lenses, which reduce the most irritating wavelengths without blocking too much light.  These can be obtained at axonoptics.com or theraspecs.com Foods: see list above.   2. Limit use of acute treatments (over-the-counter medications, triptans, etc.) to no more than 2 days per week or 10 days per month to prevent medication overuse headache (rebound headache).     3. Follow a regular schedule (including weekends and holidays): Don't skip meals. Eat a balanced diet. 8 hours of sleep nightly. Minimize stress. Exercise 30 minutes per day. Being overweight is associated with a 5 times increased risk of chronic migraine. Keep well hydrated and drink 6-8 glasses of water per day.   4. Initiate non-pharmacologic measures at the earliest onset of your headache. Rest and quiet environment. Relax and reduce stress. Breathe2Relax is a free app that can instruct you on    some simple relaxtion and breathing techniques. Http://Dawnbuse.com is a    free website that provides teaching videos on relaxation.  Also, there are  many apps that   can be downloaded for "mindful" relaxation.  An app called YOGA NIDRA will help walk you through mindfulness. Another app called Calm can be downloaded to give you a structured mindfulness guide with daily reminders and skill development. Headspace for guided meditation Mindfulness Based Stress Reduction Online Course: www.palousemindfulness.com Cold compresses.   5. Don't wait!! Take the maximum allowable dosage of prescribed medication at the first sign of migraine.   6. Compliance:  Take prescribed medication regularly as directed and at the first sign of a migraine.   7. Communicate:  Call your physician when problems arise, especially if your headaches change, increase in frequency/severity, or become associated with neurological symptoms (weakness, numbness, slurred speech, etc.). Proceed to emergency room if you experience new or worsening  symptoms or symptoms do not resolve, if you have new neurologic symptoms or if headache is severe, or for any concerning symptom.   8. Headache/pain management therapies: Consider various complementary methods, including medication, behavioral therapy, psychological counselling, biofeedback, massage therapy, acupuncture, dry needling, and other modalities.  Such measures may reduce the need for medications. Counseling for pain management, where patients learn to function and ignore/minimize their pain, seems to work very well.   9. Recommend changing family's attention and focus away from patient's headaches. Instead, emphasize daily activities. If first question of day is 'How are your headaches/Do you have a headache today?', then patient will constantly think about headaches, thus making them worse. Goal is to re-direct attention away from headaches, toward daily activities and other distractions.   10. Helpful Websites: www.AmericanHeadacheSociety.org PatentHood.ch www.headaches.org TightMarket.nl www.achenet.org

## 2023-12-14 ENCOUNTER — Telehealth (INDEPENDENT_AMBULATORY_CARE_PROVIDER_SITE_OTHER): Admitting: Family Medicine

## 2023-12-14 ENCOUNTER — Other Ambulatory Visit (HOSPITAL_COMMUNITY): Payer: Self-pay

## 2023-12-14 ENCOUNTER — Encounter: Payer: Self-pay | Admitting: Family Medicine

## 2023-12-14 DIAGNOSIS — G43709 Chronic migraine without aura, not intractable, without status migrainosus: Secondary | ICD-10-CM

## 2023-12-14 MED ORDER — ELETRIPTAN HYDROBROMIDE 40 MG PO TABS: 40.0000 mg | ORAL_TABLET | ORAL | 11 refills | Status: AC | PRN

## 2023-12-14 MED ORDER — ZONISAMIDE 25 MG PO CAPS: 75.0000 mg | ORAL_CAPSULE | Freq: Every day | ORAL | 3 refills | Status: AC

## 2023-12-14 MED ORDER — QULIPTA 60 MG PO TABS
60.0000 mg | ORAL_TABLET | Freq: Every day | ORAL | 3 refills | Status: AC
  Filled 2023-12-14 – 2024-01-07 (×2): qty 90, 90d supply, fill #0
  Filled 2024-03-27: qty 90, 90d supply, fill #1

## 2023-12-16 ENCOUNTER — Other Ambulatory Visit

## 2023-12-16 DIAGNOSIS — Z006 Encounter for examination for normal comparison and control in clinical research program: Secondary | ICD-10-CM

## 2023-12-29 ENCOUNTER — Encounter: Payer: Self-pay | Admitting: Obstetrics and Gynecology

## 2024-01-05 ENCOUNTER — Other Ambulatory Visit (HOSPITAL_COMMUNITY): Payer: Self-pay

## 2024-01-05 MED ORDER — LISDEXAMFETAMINE DIMESYLATE 40 MG PO CAPS
40.0000 mg | ORAL_CAPSULE | Freq: Every day | ORAL | 0 refills | Status: AC
  Filled 2024-01-05 – 2024-01-07 (×2): qty 30, 30d supply, fill #0

## 2024-01-07 ENCOUNTER — Other Ambulatory Visit: Payer: Self-pay

## 2024-01-07 ENCOUNTER — Other Ambulatory Visit (HOSPITAL_COMMUNITY): Payer: Self-pay

## 2024-01-07 LAB — GENECONNECT MOLECULAR SCREEN: Genetic Analysis Overall Interpretation: NEGATIVE

## 2024-01-07 NOTE — Progress Notes (Unsigned)
 48 y.o. [id number] Married Caucasian female here for annual exam. Has been having low libido.   Spotting occasionally with her periods. Happy with her IUD.  Has hot flashes and night sweats, but less often.   They are manageable.   No vaginal dryness.    Sees [facility] and has connective tissue disorder and possible lupus.  Taking Methotrexate and feels better.  It is controlling flares of fatigue.  Had genetic testing and is negative for BRCA.  PCP: Katina Pfeiffer, PA-C   No LMP recorded. (Menstrual status: IUD).           Sexually active: Yes.    The current method of family planning is IUD- Mirena  IUD (07/28/2021).    Menopausal hormone therapy:  none.  Exercising: No.   Smoker:  Former   OB History  Gravida Para Term Preterm AB Living  5 2 2  0 3 2  SAB IAB Ectopic Multiple Live Births  0 0 3 0 1    # Outcome Date GA Lbr Len/2nd Weight Sex Type Anes PTL Lv  5 Term 04/19/12 [redacted]w[redacted]d / 00:05 8 lb 3 oz (3.714 kg) M Vag-Spont EPI  LIV  4 Ectopic 11/2010          3 Term 05/2008 [redacted]w[redacted]d 16:00 6 lb 10 oz (3.005 kg) F Vag-Spont EPI    2 Ectopic 04/2007          1 Ectopic 11/2006             HEALTH MAINTENANCE: Last 2 paps:  2021 normal per patient  History of abnormal Pap or positive HPV:  no Mammogram:   08/30/23 Breast Density Cat C, BIRADS Cat 1 neg  Colonoscopy:   Cologuard in the last year - neg - PCP office. Bone Density:  n/a  Result  n/a   Immunization History  Administered Date(s) Administered   Influenza Split 02/25/2012   Influenza, Mdck, Trivalent,PF 6+ MOS(egg free) 03/16/2013   Influenza-Unspecified 03/16/2013   Tdap 04/19/2012   Unspecified SARS-COV-2 Vaccination 08/03/2019, 09/02/2019      reports that she has quit smoking. Her smoking use included cigarettes. She has never used smokeless tobacco. She reports current alcohol use. She reports that she does not use drugs.  Past Medical History:  Diagnosis Date   Acute mastitis of left  breast 05/24/2008   Allergy    Anxiety    Bladder infection    Chronic headaches    since age 48   Constipation 11/24/2006   Dengue fever    2002   Depression    Ectopic pregnancy 11/26/2006   H/O candidiasis    H/O urinary frequency 09/13/2007   History of bulimia    History of shingles    17 yrs. old ?   Hx of migraines    Hx of migraines    with aura   Hypothyroidism    H/O   Infertility, female 12/25/2009   Lupus (systemic lupus erythematosus) (HCC)    Nausea 11/24/2006   Spotting in first trimester 11/22/2006    @ 4-5 weeks    Past Surgical History:  Procedure Laterality Date   COLON SURGERY     ECTOPIC PREGNANCY SURGERY     x 2   MANDIBLE FRACTURE SURGERY Bilateral    SALPINGECTOMY     right    Current Outpatient Medications  Medication Sig Dispense Refill   Atogepant  (QULIPTA ) 60 MG TABS Take 1 tablet (60 mg total) by mouth daily. 90 tablet 3  Cholecalciferol 50 MCG (2000 UT) TABS 1 tablet Orally Once a day     diclofenac  Sodium (VOLTAREN ) 1 % GEL Apply 4 g topically 4 (four) times daily as needed. 500 g 6   eletriptan  (RELPAX ) 40 MG tablet Take 1 tablet (40 mg total) by mouth every 2 (two) hours as needed for migraine or headache. May repeat in 2 hours if headache persists or recurs. 12 tablet 11   famotidine (PEPCID) 40 MG tablet Take 40 mg by mouth.     FLUoxetine (PROZAC) 40 MG capsule Take 40 mg by mouth daily.     folic acid (FOLVITE) 1 MG tablet Take 1 mg by mouth daily. (Patient taking differently: Take 1 mg by mouth daily. Taking 2 a day)     levonorgestrel  (MIRENA , 52 MG,) 20 MCG/DAY IUD as directed Intrauterine     lisdexamfetamine (VYVANSE ) 40 MG capsule Take 1 capsule by mouth daily 30 capsule 0   lisdexamfetamine (VYVANSE ) 40 MG capsule Take 1 capsule by mouth daily 30 capsule 0   lisdexamfetamine (VYVANSE ) 40 MG capsule Take 1 capsule (40 mg total) by mouth daily. 30 capsule 0   lisdexamfetamine (VYVANSE ) 40 MG capsule Take 1 capsule by mouth  daily 30 capsule 0   methotrexate (RHEUMATREX) 2.5 MG tablet Take 15 mg by mouth once a week.     ondansetron  (ZOFRAN ) 4 MG tablet      ondansetron  (ZOFRAN -ODT) 8 MG disintegrating tablet ondansetron  8 mg disintegrating tablet     Probiotic Product (PROBIOTIC PO) Take by mouth.     valACYclovir (VALTREX) 500 MG tablet Take 500 mg by mouth.     zonisamide  (ZONEGRAN ) 25 MG capsule Take 3 capsules (75 mg total) by mouth daily. 270 capsule 3   No current facility-administered medications for this visit.    ALLERGIES: Ibuprofen , Melatonin, Tape, Wound dressing adhesive, Wound dressings, Hydrocortisone, Rizatriptan, Sumatriptan, and Cinnamon  Family History  Problem Relation Age of Onset   Stroke Mother    Migraines Mother    Cancer Mother        breast   Atrial fibrillation Mother    Migraines Father    Cancer Father        prostate   Alcohol abuse Father    Hyperlipidemia Father    ADD / ADHD Father    Mental illness Brother        ADHD   COPD Maternal Grandmother        emphysema   Cancer Maternal Grandmother        thyroid  and colon   Mental illness Maternal Grandfather    Diabetes Paternal Grandmother    Heart disease Paternal Grandmother    Hypertension Paternal Grandmother    Hyperlipidemia Paternal Grandmother    Cancer Paternal Grandfather        bladder    Review of Systems  All other systems reviewed and are negative.   PHYSICAL EXAM:  BP 114/82 (BP Location: Left Arm, Patient Position: Sitting)   Pulse 78   Ht 5' 4.5 (1.638 m)   Wt 167 lb (75.8 kg)   SpO2 98%   BMI 28.22 kg/m     General appearance: alert, cooperative and appears stated age Head: normocephalic, without obvious abnormality, atraumatic Neck: no adenopathy, supple, symmetrical, trachea midline and thyroid  normal to inspection and palpation Lungs: clear to auscultation bilaterally Breasts: normal appearance, no masses or tenderness, No nipple retraction or dimpling, No nipple discharge or  bleeding, No axillary adenopathy Heart: regular rate and  rhythm Abdomen: soft, non-tender; no masses, no organomegaly Extremities: extremities normal, atraumatic, no cyanosis or edema Skin: skin color, texture, turgor normal. No rashes or lesions Lymph nodes: cervical, supraclavicular, and axillary nodes normal. Neurologic: grossly normal  Pelvic: External genitalia:  no lesions              No abnormal inguinal nodes palpated.              Urethra:  normal appearing urethra with no masses, tenderness or lesions              Bartholins and Skenes: normal                 Vagina: normal appearing vagina with normal color and discharge, no lesions              Cervix: no lesions              Pap taken: yes Bimanual Exam:  Uterus:  normal size, contour, position, consistency, mobility, non-tender              Adnexa: no mass, fullness, tenderness              Rectal exam: yes.  Confirms.              Anus:  normal sphincter tone, no lesions  Chaperone was present for exam:  Kari HERO, CMA  ASSESSMENT: Well woman visit with gynecologic exam. Cervical cancer screening.  Menopausal symptoms.  Low libido. PHQ-2-9: 0  PLAN: Mammogram screening discussed. Self breast awareness reviewed. Pap and HRV collected:  yes. Guidelines for Calcium, Vitamin D , regular exercise program including cardiovascular and weight bearing exercise. Medication refills:  NA Will check FSH, estradiol , and testosterone levels.  Discused WHI and use of HRT which can increase risk of PE, DVT, MI, stroke and breast cancer.  No Rx at this time.  Low libido reviewed and potential multifactorial etiology.  We reviewed testosterone tx or Wellbutrin for low libido.  Side effects of testosterone reviewed.  No Rx at this time. Follow up:  yearly and prn

## 2024-01-08 ENCOUNTER — Other Ambulatory Visit (HOSPITAL_COMMUNITY): Payer: Self-pay

## 2024-01-13 ENCOUNTER — Encounter: Payer: Self-pay | Admitting: Obstetrics and Gynecology

## 2024-01-13 ENCOUNTER — Other Ambulatory Visit (HOSPITAL_COMMUNITY)
Admission: RE | Admit: 2024-01-13 | Discharge: 2024-01-13 | Disposition: A | Discharge status: Home / Self Care | Source: Ambulatory Visit | Attending: Obstetrics and Gynecology | Admitting: Obstetrics and Gynecology

## 2024-01-13 ENCOUNTER — Ambulatory Visit: Admitting: Obstetrics and Gynecology

## 2024-01-13 VITALS — BP 114/82 | HR 78 | Ht 64.5 in | Wt 167.0 lb

## 2024-01-13 DIAGNOSIS — Z01419 Encounter for gynecological examination (general) (routine) without abnormal findings: Secondary | ICD-10-CM

## 2024-01-13 DIAGNOSIS — R6882 Decreased libido: Secondary | ICD-10-CM

## 2024-01-13 DIAGNOSIS — Z124 Encounter for screening for malignant neoplasm of cervix: Secondary | ICD-10-CM

## 2024-01-13 DIAGNOSIS — Z1331 Encounter for screening for depression: Secondary | ICD-10-CM | POA: Diagnosis not present

## 2024-01-13 DIAGNOSIS — N951 Menopausal and female climacteric states: Secondary | ICD-10-CM

## 2024-01-13 NOTE — Patient Instructions (Signed)

## 2024-01-17 ENCOUNTER — Ambulatory Visit: Payer: Self-pay | Admitting: Obstetrics and Gynecology

## 2024-01-17 LAB — CYTOLOGY - PAP
Comment: NEGATIVE
Diagnosis: NEGATIVE
Diagnosis: REACTIVE
High risk HPV: NEGATIVE

## 2024-01-19 LAB — TESTOS,TOTAL,FREE AND SHBG (FEMALE)
Free Testosterone: 2.1 pg/mL (ref 0.1–6.4)
Sex Hormone Binding: 32 nmol/L (ref 17–124)
Testosterone, Total, LC-MS-MS: 15 ng/dL (ref 2–45)

## 2024-01-19 LAB — ESTRADIOL: Estradiol: 258 pg/mL

## 2024-01-19 LAB — FOLLICLE STIMULATING HORMONE: FSH: 9.5 m[IU]/mL

## 2024-02-07 ENCOUNTER — Other Ambulatory Visit (HOSPITAL_COMMUNITY): Payer: Self-pay

## 2024-02-07 MED ORDER — LISDEXAMFETAMINE DIMESYLATE 40 MG PO CAPS
40.0000 mg | ORAL_CAPSULE | Freq: Every day | ORAL | 0 refills | Status: AC
  Filled 2024-02-07: qty 30, 30d supply, fill #0

## 2024-02-14 ENCOUNTER — Other Ambulatory Visit: Payer: Self-pay

## 2024-02-14 ENCOUNTER — Other Ambulatory Visit (HOSPITAL_COMMUNITY): Payer: Self-pay

## 2024-02-14 MED ORDER — FOLIC ACID 5 MG PO CAPS: 5.0000 mg | ORAL_CAPSULE | Freq: Every day | ORAL | 4 refills | Status: AC

## 2024-02-17 ENCOUNTER — Other Ambulatory Visit (HOSPITAL_COMMUNITY): Payer: Self-pay

## 2024-03-06 ENCOUNTER — Other Ambulatory Visit (HOSPITAL_COMMUNITY): Payer: Self-pay

## 2024-03-06 MED ORDER — LISDEXAMFETAMINE DIMESYLATE 40 MG PO CAPS: 40.0000 mg | ORAL_CAPSULE | Freq: Every day | ORAL | 0 refills | Status: AC

## 2024-03-27 ENCOUNTER — Other Ambulatory Visit (HOSPITAL_COMMUNITY): Payer: Self-pay

## 2024-03-28 ENCOUNTER — Other Ambulatory Visit (HOSPITAL_COMMUNITY): Payer: Self-pay

## 2024-04-18 ENCOUNTER — Other Ambulatory Visit (HOSPITAL_COMMUNITY): Payer: Self-pay

## 2024-04-24 ENCOUNTER — Other Ambulatory Visit (HOSPITAL_COMMUNITY): Payer: Self-pay

## 2024-04-24 MED ORDER — LISDEXAMFETAMINE DIMESYLATE 40 MG PO CAPS
40.0000 mg | ORAL_CAPSULE | Freq: Every day | ORAL | 0 refills | Status: AC
  Filled 2024-04-24: qty 30, 30d supply, fill #0

## 2024-04-25 ENCOUNTER — Other Ambulatory Visit (HOSPITAL_COMMUNITY): Payer: Self-pay

## 2024-05-25 ENCOUNTER — Other Ambulatory Visit (HOSPITAL_COMMUNITY): Payer: Self-pay

## 2024-05-25 MED ORDER — LISDEXAMFETAMINE DIMESYLATE 50 MG PO CAPS
50.0000 mg | ORAL_CAPSULE | Freq: Every day | ORAL | 0 refills | Status: AC
  Filled 2024-05-25: qty 30, 30d supply, fill #0

## 2024-05-26 ENCOUNTER — Other Ambulatory Visit (HOSPITAL_COMMUNITY): Payer: Self-pay

## 2024-05-27 ENCOUNTER — Other Ambulatory Visit (HOSPITAL_COMMUNITY): Payer: Self-pay

## 2024-06-01 ENCOUNTER — Other Ambulatory Visit (HOSPITAL_COMMUNITY): Payer: Self-pay

## 2024-06-02 ENCOUNTER — Other Ambulatory Visit (HOSPITAL_COMMUNITY): Payer: Self-pay
